# Patient Record
Sex: Female | Born: 1995 | Race: White | Hispanic: No | Marital: Married | Smoking: Never smoker
Health system: Southern US, Community
[De-identification: ages and names within clinical notes are randomized; demographics above are authoritative.]

## PROBLEM LIST (undated history)

## (undated) DIAGNOSIS — E119 Type 2 diabetes mellitus without complications: Secondary | ICD-10-CM

## (undated) DIAGNOSIS — Z8489 Family history of other specified conditions: Secondary | ICD-10-CM

## (undated) DIAGNOSIS — Z789 Other specified health status: Secondary | ICD-10-CM

## (undated) DIAGNOSIS — O24419 Gestational diabetes mellitus in pregnancy, unspecified control: Secondary | ICD-10-CM

## (undated) DIAGNOSIS — F419 Anxiety disorder, unspecified: Secondary | ICD-10-CM

## (undated) HISTORY — DX: Gestational diabetes mellitus in pregnancy, unspecified control: O24.419

## (undated) HISTORY — PX: WISDOM TOOTH EXTRACTION: SHX21

## (undated) HISTORY — PX: NO PAST SURGERIES: SHX2092

## (undated) HISTORY — DX: Other specified health status: Z78.9

---
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
## 2016-12-09 DIAGNOSIS — L709 Acne, unspecified: Secondary | ICD-10-CM | POA: Diagnosis not present

## 2017-03-22 DIAGNOSIS — L255 Unspecified contact dermatitis due to plants, except food: Secondary | ICD-10-CM | POA: Diagnosis not present

## 2017-03-24 DIAGNOSIS — M546 Pain in thoracic spine: Secondary | ICD-10-CM | POA: Diagnosis not present

## 2017-03-24 DIAGNOSIS — M62830 Muscle spasm of back: Secondary | ICD-10-CM | POA: Diagnosis not present

## 2017-03-24 DIAGNOSIS — M545 Low back pain: Secondary | ICD-10-CM | POA: Diagnosis not present

## 2017-03-27 DIAGNOSIS — M546 Pain in thoracic spine: Secondary | ICD-10-CM | POA: Diagnosis not present

## 2017-03-27 DIAGNOSIS — M545 Low back pain: Secondary | ICD-10-CM | POA: Diagnosis not present

## 2017-03-27 DIAGNOSIS — M62830 Muscle spasm of back: Secondary | ICD-10-CM | POA: Diagnosis not present

## 2017-04-06 DIAGNOSIS — M545 Low back pain: Secondary | ICD-10-CM | POA: Diagnosis not present

## 2017-04-06 DIAGNOSIS — M62830 Muscle spasm of back: Secondary | ICD-10-CM | POA: Diagnosis not present

## 2017-04-06 DIAGNOSIS — M546 Pain in thoracic spine: Secondary | ICD-10-CM | POA: Diagnosis not present

## 2017-04-19 DIAGNOSIS — M62830 Muscle spasm of back: Secondary | ICD-10-CM | POA: Diagnosis not present

## 2017-04-19 DIAGNOSIS — M546 Pain in thoracic spine: Secondary | ICD-10-CM | POA: Diagnosis not present

## 2017-04-19 DIAGNOSIS — M545 Low back pain: Secondary | ICD-10-CM | POA: Diagnosis not present

## 2017-04-20 DIAGNOSIS — M546 Pain in thoracic spine: Secondary | ICD-10-CM | POA: Diagnosis not present

## 2017-04-20 DIAGNOSIS — M545 Low back pain: Secondary | ICD-10-CM | POA: Diagnosis not present

## 2017-04-20 DIAGNOSIS — M62830 Muscle spasm of back: Secondary | ICD-10-CM | POA: Diagnosis not present

## 2017-04-26 DIAGNOSIS — M545 Low back pain: Secondary | ICD-10-CM | POA: Diagnosis not present

## 2017-04-26 DIAGNOSIS — M62830 Muscle spasm of back: Secondary | ICD-10-CM | POA: Diagnosis not present

## 2017-04-26 DIAGNOSIS — M546 Pain in thoracic spine: Secondary | ICD-10-CM | POA: Diagnosis not present

## 2017-04-27 DIAGNOSIS — M545 Low back pain: Secondary | ICD-10-CM | POA: Diagnosis not present

## 2017-04-27 DIAGNOSIS — M546 Pain in thoracic spine: Secondary | ICD-10-CM | POA: Diagnosis not present

## 2017-04-27 DIAGNOSIS — M62830 Muscle spasm of back: Secondary | ICD-10-CM | POA: Diagnosis not present

## 2017-05-02 DIAGNOSIS — M62830 Muscle spasm of back: Secondary | ICD-10-CM | POA: Diagnosis not present

## 2017-05-02 DIAGNOSIS — M545 Low back pain: Secondary | ICD-10-CM | POA: Diagnosis not present

## 2017-05-02 DIAGNOSIS — M546 Pain in thoracic spine: Secondary | ICD-10-CM | POA: Diagnosis not present

## 2017-05-04 DIAGNOSIS — M62830 Muscle spasm of back: Secondary | ICD-10-CM | POA: Diagnosis not present

## 2017-05-04 DIAGNOSIS — M546 Pain in thoracic spine: Secondary | ICD-10-CM | POA: Diagnosis not present

## 2017-05-04 DIAGNOSIS — M545 Low back pain: Secondary | ICD-10-CM | POA: Diagnosis not present

## 2017-05-10 DIAGNOSIS — M545 Low back pain: Secondary | ICD-10-CM | POA: Diagnosis not present

## 2017-05-10 DIAGNOSIS — M62830 Muscle spasm of back: Secondary | ICD-10-CM | POA: Diagnosis not present

## 2017-05-10 DIAGNOSIS — M546 Pain in thoracic spine: Secondary | ICD-10-CM | POA: Diagnosis not present

## 2017-05-21 DIAGNOSIS — J069 Acute upper respiratory infection, unspecified: Secondary | ICD-10-CM | POA: Diagnosis not present

## 2017-07-03 DIAGNOSIS — L709 Acne, unspecified: Secondary | ICD-10-CM | POA: Diagnosis not present

## 2017-07-03 DIAGNOSIS — Z1389 Encounter for screening for other disorder: Secondary | ICD-10-CM | POA: Diagnosis not present

## 2017-09-07 DIAGNOSIS — Z23 Encounter for immunization: Secondary | ICD-10-CM | POA: Diagnosis not present

## 2017-11-02 DIAGNOSIS — L700 Acne vulgaris: Secondary | ICD-10-CM | POA: Diagnosis not present

## 2017-12-04 DIAGNOSIS — L700 Acne vulgaris: Secondary | ICD-10-CM | POA: Diagnosis not present

## 2017-12-07 DIAGNOSIS — L700 Acne vulgaris: Secondary | ICD-10-CM | POA: Diagnosis not present

## 2018-01-08 DIAGNOSIS — L700 Acne vulgaris: Secondary | ICD-10-CM | POA: Diagnosis not present

## 2018-01-15 DIAGNOSIS — L700 Acne vulgaris: Secondary | ICD-10-CM | POA: Diagnosis not present

## 2018-01-25 DIAGNOSIS — L700 Acne vulgaris: Secondary | ICD-10-CM | POA: Diagnosis not present

## 2018-03-02 DIAGNOSIS — L700 Acne vulgaris: Secondary | ICD-10-CM | POA: Diagnosis not present

## 2018-04-04 DIAGNOSIS — L700 Acne vulgaris: Secondary | ICD-10-CM | POA: Diagnosis not present

## 2018-04-18 DIAGNOSIS — L700 Acne vulgaris: Secondary | ICD-10-CM | POA: Diagnosis not present

## 2018-05-07 DIAGNOSIS — L700 Acne vulgaris: Secondary | ICD-10-CM | POA: Diagnosis not present

## 2018-06-07 DIAGNOSIS — L700 Acne vulgaris: Secondary | ICD-10-CM | POA: Diagnosis not present

## 2018-06-13 DIAGNOSIS — Z23 Encounter for immunization: Secondary | ICD-10-CM | POA: Diagnosis not present

## 2018-06-18 DIAGNOSIS — Z111 Encounter for screening for respiratory tuberculosis: Secondary | ICD-10-CM | POA: Diagnosis not present

## 2018-07-17 DIAGNOSIS — L700 Acne vulgaris: Secondary | ICD-10-CM | POA: Diagnosis not present

## 2018-08-17 DIAGNOSIS — L700 Acne vulgaris: Secondary | ICD-10-CM | POA: Diagnosis not present

## 2018-09-17 DIAGNOSIS — L700 Acne vulgaris: Secondary | ICD-10-CM | POA: Diagnosis not present

## 2018-10-29 DIAGNOSIS — L700 Acne vulgaris: Secondary | ICD-10-CM | POA: Diagnosis not present

## 2018-12-12 DIAGNOSIS — L700 Acne vulgaris: Secondary | ICD-10-CM | POA: Diagnosis not present

## 2020-12-30 ENCOUNTER — Ambulatory Visit (INDEPENDENT_AMBULATORY_CARE_PROVIDER_SITE_OTHER): Payer: No Typology Code available for payment source | Admitting: Family Medicine

## 2020-12-30 ENCOUNTER — Other Ambulatory Visit: Payer: Self-pay

## 2020-12-30 ENCOUNTER — Other Ambulatory Visit (HOSPITAL_COMMUNITY)
Admission: RE | Admit: 2020-12-30 | Discharge: 2020-12-30 | Disposition: A | Discharge status: Home / Self Care | Payer: No Typology Code available for payment source | Source: Ambulatory Visit | Attending: Family Medicine | Admitting: Family Medicine

## 2020-12-30 ENCOUNTER — Encounter: Payer: Self-pay | Admitting: Family Medicine

## 2020-12-30 VITALS — BP 138/82 | HR 103 | Ht 61.0 in | Wt 164.0 lb

## 2020-12-30 DIAGNOSIS — Z01419 Encounter for gynecological examination (general) (routine) without abnormal findings: Secondary | ICD-10-CM

## 2020-12-30 NOTE — Progress Notes (Signed)
GYNECOLOGY ANNUAL PREVENTATIVE CARE ENCOUNTER NOTE  Subjective:   Crystal Powell is a 25 y.o. G0P0000 female here for a routine annual gynecologic exam.  Current complaints: had nexplanon removed in October. Had heavy periods with a lot of cramping in November and December. Did have COVID vaccine in September or October.   Denies abnormal vaginal bleeding, discharge, pelvic pain, problems with intercourse or other gynecologic concerns.    Menses approximately 30 day interval. Currently trying to get pregnant. Does use ovulation kit, just to see when ovulating. Not timing sex. Ovulation on day 18.  Gynecologic History Patient's last menstrual period was 12/10/2020. Patient is sexually active  Contraception: none Last Pap: 2019. Results were: normal  Obstetric History OB History  Gravida Para Term Preterm AB Living  0 0 0 0 0 0  SAB IAB Ectopic Multiple Live Births  0 0 0 0 0    History reviewed. No pertinent past medical history.  History reviewed. No pertinent surgical history.  Current Outpatient Medications on File Prior to Visit  Medication Sig Dispense Refill  . Prenatal Vit-Fe Fumarate-FA (PRENATAL VITAMINS PO) Take by mouth.     No current facility-administered medications on file prior to visit.    Allergies  Allergen Reactions  . Peanut-Containing Drug Products     Social History   Socioeconomic History  . Marital status: Married    Spouse name: Not on file  . Number of children: Not on file  . Years of education: Not on file  . Highest education level: Not on file  Occupational History  . Not on file  Tobacco Use  . Smoking status: Never Smoker  . Smokeless tobacco: Never Used  Substance and Sexual Activity  . Alcohol use: Never  . Drug use: Never  . Sexual activity: Yes    Birth control/protection: None  Other Topics Concern  . Not on file  Social History Narrative  . Not on file   Social Determinants of Health   Financial Resource  Strain: Not on file  Food Insecurity: Not on file  Transportation Needs: Not on file  Physical Activity: Not on file  Stress: Not on file  Social Connections: Not on file  Intimate Partner Violence: Not on file    History reviewed. No pertinent family history.  The following portions of the patient's history were reviewed and updated as appropriate: allergies, current medications, past family history, past medical history, past social history, past surgical history and problem list.  Review of Systems Pertinent items are noted in HPI.   Objective:  BP 138/82   Pulse (!) 103   Ht 5' 1" (1.549 m)   Wt 164 lb (74.4 kg)   LMP 12/10/2020   BMI 30.99 kg/m  Wt Readings from Last 3 Encounters:  12/30/20 164 lb (74.4 kg)     Chaperone present during exam  CONSTITUTIONAL: Well-developed, well-nourished female in no acute distress.  HENT:  Normocephalic, atraumatic, External right and left ear normal. Oropharynx is clear and moist EYES: Conjunctivae and EOM are normal. Pupils are equal, round, and reactive to light. No scleral icterus.  NECK: Normal range of motion, supple, no masses.  Normal thyroid.   CARDIOVASCULAR: Normal heart rate noted, regular rhythm RESPIRATORY: Clear to auscultation bilaterally. Effort and breath sounds normal, no problems with respiration noted. BREASTS: Symmetric in size. No masses, skin changes, nipple drainage, or lymphadenopathy. ABDOMEN: Soft, normal bowel sounds, no distention noted.  No tenderness, rebound or guarding.  PELVIC: Normal appearing  external genitalia; normal appearing vaginal mucosa and cervix.  No abnormal discharge noted.  Normal uterine size, no other palpable masses, no uterine or adnexal tenderness. MUSCULOSKELETAL: Normal range of motion. No tenderness.  No cyanosis, clubbing, or edema.  2+ distal pulses. SKIN: Skin is warm and dry. No rash noted. Not diaphoretic. No erythema. No pallor. NEUROLOGIC: Alert and oriented to person,  place, and time. Normal reflexes, muscle tone coordination. No cranial nerve deficit noted. PSYCHIATRIC: Normal mood and affect. Normal behavior. Normal judgment and thought content.  Assessment:  Annual gynecologic examination with pap smear   Plan:  1. Well Woman Exam Will follow up results of pap smear and manage accordingly. STD testing discussed. Patient declined testing - Cytology - PAP( )   Routine preventative health maintenance measures emphasized. Please refer to After Visit Summary for other counseling recommendations.    Jacob Stinson, DO [facility] 

## 2020-12-31 LAB — CYTOLOGY - PAP
Adequacy: ABSENT
Diagnosis: NEGATIVE

## 2021-03-25 ENCOUNTER — Encounter: Payer: Self-pay | Admitting: Family Medicine

## 2021-03-25 ENCOUNTER — Other Ambulatory Visit: Payer: Self-pay

## 2021-03-25 ENCOUNTER — Ambulatory Visit (INDEPENDENT_AMBULATORY_CARE_PROVIDER_SITE_OTHER): Payer: No Typology Code available for payment source | Admitting: Family Medicine

## 2021-03-25 VITALS — BP 135/81 | HR 96 | Wt 163.0 lb

## 2021-03-25 DIAGNOSIS — Z3169 Encounter for other general counseling and advice on procreation: Secondary | ICD-10-CM

## 2021-03-25 NOTE — Progress Notes (Signed)
   Subjective:    Patient ID: Crystal Powell, female    DOB: July 05, 1996, 25 y.o.   MRN: [id number]  HPI  Crystal Powell is a 25 y.o. G0P0000 female here for difficulty with conception.  Current complaints: has been using ovulation kit for several months. Having sex several times a week.    Menses approximately 30 day interval. Currently trying to get pregnant. Does use ovulation kit, just to see when ovulating. Not timing sex. Ovulation on day 18.   Review of Systems     Objective:   Physical Exam Vitals reviewed.  Constitutional:      Appearance: Normal appearance.  Cardiovascular:     Rate and Rhythm: Normal rate and regular rhythm.     Pulses: Normal pulses.     Heart sounds: Normal heart sounds.  Pulmonary:     Effort: Pulmonary effort is normal.     Breath sounds: Normal breath sounds.  Abdominal:     General: Abdomen is flat. There is no distension.     Palpations: Abdomen is soft. There is no mass.     Tenderness: There is no abdominal tenderness.     Hernia: No hernia is present.  Skin:    Capillary Refill: Capillary refill takes less than 2 seconds.  Neurological:     General: No focal deficit present.     Mental Status: She is alert.  Psychiatric:        Mood and Affect: Mood normal.        Behavior: Behavior normal.        Thought Content: Thought content normal.        Judgment: Judgment normal.        Assessment & Plan:  1. Infertility counseling Will check HSG and semen analysis. Refer to REI. - DG Hysterogram (HSG); Future - Ambulatory referral to Endocrinology

## 2021-04-13 ENCOUNTER — Encounter: Payer: Self-pay | Admitting: General Practice

## 2021-04-15 DIAGNOSIS — N469 Male infertility, unspecified: Secondary | ICD-10-CM

## 2021-04-16 NOTE — Addendum Note (Signed)
Addended by: Levie Heritage on: 04/16/2021 07:07 PM   Modules accepted: Orders

## 2021-04-20 ENCOUNTER — Encounter: Payer: Self-pay | Admitting: General Practice

## 2021-06-01 ENCOUNTER — Other Ambulatory Visit: Payer: Self-pay

## 2021-06-01 ENCOUNTER — Inpatient Hospital Stay (HOSPITAL_COMMUNITY): Payer: No Typology Code available for payment source

## 2021-06-01 ENCOUNTER — Encounter (HOSPITAL_COMMUNITY): Payer: Self-pay | Admitting: Family Medicine

## 2021-06-01 ENCOUNTER — Inpatient Hospital Stay (HOSPITAL_COMMUNITY)
Admission: AD | Admit: 2021-06-01 | Discharge: 2021-06-02 | Disposition: A | Discharge status: Home / Self Care | Payer: No Typology Code available for payment source | Attending: Family Medicine | Admitting: Family Medicine

## 2021-06-01 DIAGNOSIS — O209 Hemorrhage in early pregnancy, unspecified: Secondary | ICD-10-CM | POA: Diagnosis present

## 2021-06-01 DIAGNOSIS — O468X1 Other antepartum hemorrhage, first trimester: Secondary | ICD-10-CM

## 2021-06-01 DIAGNOSIS — O208 Other hemorrhage in early pregnancy: Secondary | ICD-10-CM | POA: Insufficient documentation

## 2021-06-01 DIAGNOSIS — O3680X0 Pregnancy with inconclusive fetal viability, not applicable or unspecified: Secondary | ICD-10-CM

## 2021-06-01 DIAGNOSIS — Z3A01 Less than 8 weeks gestation of pregnancy: Secondary | ICD-10-CM | POA: Insufficient documentation

## 2021-06-01 LAB — URINALYSIS, ROUTINE W REFLEX MICROSCOPIC
Bilirubin Urine: NEGATIVE
Glucose, UA: NEGATIVE mg/dL
Hgb urine dipstick: NEGATIVE
Ketones, ur: 5 mg/dL — AB
Leukocytes,Ua: NEGATIVE
Nitrite: NEGATIVE
Protein, ur: NEGATIVE mg/dL
Specific Gravity, Urine: 1.005 (ref 1.005–1.030)
pH: 6 (ref 5.0–8.0)

## 2021-06-01 LAB — COMPREHENSIVE METABOLIC PANEL
ALT: 32 U/L (ref 0–44)
AST: 31 U/L (ref 15–41)
Albumin: 4.3 g/dL (ref 3.5–5.0)
Alkaline Phosphatase: 39 U/L (ref 38–126)
Anion gap: 12 (ref 5–15)
BUN: 5 mg/dL — ABNORMAL LOW (ref 6–20)
CO2: 20 mmol/L — ABNORMAL LOW (ref 22–32)
Calcium: 8.9 mg/dL (ref 8.9–10.3)
Chloride: 101 mmol/L (ref 98–111)
Creatinine, Ser: 0.66 mg/dL (ref 0.44–1.00)
GFR, Estimated: >60 mL/min (ref >60)
Glucose, Bld: 80 mg/dL (ref 70–99)
Potassium: 3.5 mmol/L (ref 3.5–5.1)
Sodium: 133 mmol/L — ABNORMAL LOW (ref 135–145)
Total Bilirubin: 1.1 mg/dL (ref 0.3–1.2)
Total Protein: 7.3 g/dL (ref 6.5–8.1)

## 2021-06-01 LAB — TYPE AND SCREEN
ABO/RH(D): A NEG
Antibody Screen: NEGATIVE

## 2021-06-01 LAB — POCT PREGNANCY, URINE: Preg Test, Ur: POSITIVE — AB

## 2021-06-01 LAB — WET PREP, GENITAL
Clue Cells Wet Prep HPF POC: NONE SEEN
Sperm: NONE SEEN
Trich, Wet Prep: NONE SEEN
WBC, Wet Prep HPF POC: NONE SEEN
Yeast Wet Prep HPF POC: NONE SEEN

## 2021-06-01 LAB — CBC WITH DIFFERENTIAL/PLATELET
Abs Immature Granulocytes: 0.04 10*3/uL (ref 0.00–0.07)
Basophils Absolute: 0 10*3/uL (ref 0.0–0.1)
Basophils Relative: 0 %
Eosinophils Absolute: 0.1 10*3/uL (ref 0.0–0.5)
Eosinophils Relative: 1 %
HCT: 40.6 % (ref 36.0–46.0)
Hemoglobin: 14.3 g/dL (ref 12.0–15.0)
Immature Granulocytes: 0 %
Lymphocytes Relative: 26 %
Lymphs Abs: 2.6 10*3/uL (ref 0.7–4.0)
MCH: 30.7 pg (ref 26.0–34.0)
MCHC: 35.2 g/dL (ref 30.0–36.0)
MCV: 87.1 fL (ref 80.0–100.0)
Monocytes Absolute: 0.5 10*3/uL (ref 0.1–1.0)
Monocytes Relative: 5 %
Neutro Abs: 6.8 10*3/uL (ref 1.7–7.7)
Neutrophils Relative %: 68 %
Platelets: 333 10*3/uL (ref 150–400)
RBC: 4.66 MIL/uL (ref 3.87–5.11)
RDW: 12 % (ref 11.5–15.5)
WBC: 10.1 10*3/uL (ref 4.0–10.5)
nRBC: 0 % (ref 0.0–0.2)

## 2021-06-01 LAB — HCG, QUANTITATIVE, PREGNANCY: hCG, Beta Chain, Quant, S: 18168 m[IU]/mL — ABNORMAL HIGH (ref <5)

## 2021-06-01 MED ORDER — RHO D IMMUNE GLOBULIN 1500 UNIT/2ML IJ SOSY
300.0000 ug | PREFILLED_SYRINGE | Freq: Once | INTRAMUSCULAR | Status: AC
  Administered 2021-06-01: 300 ug via INTRAMUSCULAR
  Filled 2021-06-01: qty 2

## 2021-06-01 NOTE — MAU Note (Signed)
Pt instructed and successfully obtained vaginal swabs. Understands will go to u/s when u/s is ready

## 2021-06-01 NOTE — MAU Note (Signed)
Crystal Powell is a 25 y.o. at [redacted]w[redacted]d here in MAU reporting: last Wednesday she thought she may have a yeast infection so she got some Monistat 7. Noticed some pink discharge with that. Yesterday saw some bright red bleeding with some clots. Since then has had bleeding every time she wipes. No pain.   LMP: 04/10/2021  Onset of complaint: yesterday  Pain score: 0/10  Vitals:   06/01/21 1833  BP: (!) 145/85  Pulse: (!) 108  Resp: 16  Temp: 98.4 F (36.9 C)  SpO2: 98%     Lab orders placed from triage: ua, upt

## 2021-06-01 NOTE — MAU Provider Note (Addendum)
History     CSN: [id number]  Arrival date and time: 06/01/21 1805   Event Date/Time   First Provider Initiated Contact with Patient 06/01/21 2131      Chief Complaint  Patient presents with   Vaginal Bleeding   Crystal Powell is a 25 y.o. G1P0000 at [redacted]w[redacted]d who presents to MAU for vaginal bleeding which began about one week ago. Patient reports initially it was simply pink spotting when wiping, but then yesterday she experienced some small pea-sized clots, and today she is experiencing brown spotting when wiping. Patient denies any pain.  Patient's husband present for visit.  Pt denies vaginal discharge/odor/itching. Pt denies N/V, abdominal pain, constipation, diarrhea, or urinary problems. Pt denies fever, chills, fatigue, sweating or changes in appetite. Pt denies SOB or chest pain. Pt denies dizziness, HA, light-headedness, weakness.   OB History     Gravida  1   Para  0   Term  0   Preterm  0   AB  0   Living  0      SAB  0   IAB  0   Ectopic  0   Multiple  0   Live Births  0           No past medical history on file.  No past surgical history on file.  No family history on file.  Social History   Tobacco Use   Smoking status: Never   Smokeless tobacco: Never  Substance Use Topics   Alcohol use: Never   Drug use: Never    Allergies:  Allergies  Allergen Reactions   Peanut-Containing Drug Products     Medications Prior to Admission  Medication Sig Dispense Refill Last Dose   Prenatal Vit-Fe Fumarate-FA (PRENATAL VITAMINS PO) Take by mouth.       Review of Systems  Constitutional:  Negative for chills, diaphoresis, fatigue and fever.  Eyes:  Negative for visual disturbance.  Respiratory:  Negative for shortness of breath.   Cardiovascular:  Negative for chest pain.  Gastrointestinal:  Negative for abdominal pain, constipation, diarrhea, nausea and vomiting.  Genitourinary:  Positive for vaginal bleeding. Negative for  dysuria, flank pain, frequency, pelvic pain, urgency and vaginal discharge.  Neurological:  Negative for dizziness, weakness, light-headedness and headaches.   Physical Exam   Blood pressure (!) 145/85, pulse (!) 108, temperature 98.4 F (36.9 C), temperature source Oral, resp. rate 16, height 5' (1.524 m), weight 74.8 kg, last menstrual period 04/10/2021, SpO2 98 %.  Patient Vitals for the past 24 hrs:  BP Temp Temp src Pulse Resp SpO2 Height Weight  06/01/21 1833 (!) 145/85 98.4 F (36.9 C) Oral (!) 108 16 98 % 5' (1.524 m) 74.8 kg   Physical Exam Vitals and nursing note reviewed.  Constitutional:      General: She is not in acute distress.    Appearance: Normal appearance. She is not ill-appearing, toxic-appearing or diaphoretic.  HENT:     Head: Normocephalic and atraumatic.  Pulmonary:     Effort: Pulmonary effort is normal.  Neurological:     Mental Status: She is alert and oriented to person, place, and time.  Psychiatric:        Mood and Affect: Mood normal.        Behavior: Behavior normal.        Thought Content: Thought content normal.        Judgment: Judgment normal.   Results for orders placed or performed during the hospital  encounter of 06/01/21 (from the past 24 hour(s))  Urinalysis, Routine w reflex microscopic     Status: Abnormal   Collection Time: 06/01/21  6:27 PM  Result Value Ref Range   Color, Urine YELLOW YELLOW   APPearance CLEAR CLEAR   Specific Gravity, Urine 1.005 1.005 - 1.030   pH 6.0 5.0 - 8.0   Glucose, UA NEGATIVE NEGATIVE mg/dL   Hgb urine dipstick NEGATIVE NEGATIVE   Bilirubin Urine NEGATIVE NEGATIVE   Ketones, ur 5 (A) NEGATIVE mg/dL   Protein, ur NEGATIVE NEGATIVE mg/dL   Nitrite NEGATIVE NEGATIVE   Leukocytes,Ua NEGATIVE NEGATIVE  Pregnancy, urine POC     Status: Abnormal   Collection Time: 06/01/21  6:27 PM  Result Value Ref Range   Preg Test, Ur POSITIVE (A) NEGATIVE  CBC with Differential     Status: None   Collection Time:  06/01/21  6:53 PM  Result Value Ref Range   WBC 10.1 4.0 - 10.5 K/uL   RBC 4.66 3.87 - 5.11 MIL/uL   Hemoglobin 14.3 12.0 - 15.0 g/dL   HCT 14.4 31.5 - 40.0 %   MCV 87.1 80.0 - 100.0 fL   MCH 30.7 26.0 - 34.0 pg   MCHC 35.2 30.0 - 36.0 g/dL   RDW 86.7 61.9 - 50.9 %   Platelets 333 150 - 400 K/uL   nRBC 0.0 0.0 - 0.2 %   Neutrophils Relative % 68 %   Neutro Abs 6.8 1.7 - 7.7 K/uL   Lymphocytes Relative 26 %   Lymphs Abs 2.6 0.7 - 4.0 K/uL   Monocytes Relative 5 %   Monocytes Absolute 0.5 0.1 - 1.0 K/uL   Eosinophils Relative 1 %   Eosinophils Absolute 0.1 0.0 - 0.5 K/uL   Basophils Relative 0 %   Basophils Absolute 0.0 0.0 - 0.1 K/uL   Immature Granulocytes 0 %   Abs Immature Granulocytes 0.04 0.00 - 0.07 K/uL  Type and screen [facility]     Status: None   Collection Time: 06/01/21  6:53 PM  Result Value Ref Range   ABO/RH(D) A NEG    Antibody Screen NEG    Sample Expiration      06/04/2021,2359 Performed at [facility], [address] [place], [place] [address]   hCG, quantitative, pregnancy     Status: Abnormal   Collection Time: 06/01/21  6:53 PM  Result Value Ref Range   hCG, Beta Chain, Quant, S 18,168 (H) <5 mIU/mL  Comprehensive metabolic panel     Status: Abnormal   Collection Time: 06/01/21  7:51 PM  Result Value Ref Range   Sodium 133 (L) 135 - 145 mmol/L   Potassium 3.5 3.5 - 5.1 mmol/L   Chloride 101 98 - 111 mmol/L   CO2 20 (L) 22 - 32 mmol/L   Glucose, Bld 80 70 - 99 mg/dL   BUN 5 (L) 6 - 20 mg/dL   Creatinine, Ser 2.45 0.44 - 1.00 mg/dL   Calcium 8.9 8.9 - 80.9 mg/dL   Total Protein 7.3 6.5 - 8.1 g/dL   Albumin 4.3 3.5 - 5.0 g/dL   AST 31 15 - 41 U/L   ALT 32 0 - 44 U/L   Alkaline Phosphatase 39 38 - 126 U/L   Total Bilirubin 1.1 0.3 - 1.2 mg/dL   GFR, Estimated >98 >33 mL/min   Anion gap 12 5 - 15  Rh IG workup (includes ABO/Rh)     Status: None (Preliminary  result)   Collection Time: 06/01/21  7:52 PM  Result Value  Ref Range   Gestational Age(Wks) 7    Unit Number [id number]    Blood Component Type RHIG    Unit division 00    Status of Unit ISSUED    Transfusion Status      OK TO TRANSFUSE Performed at [facility], [address], [place], [place] [address]    No results found.  MAU Course  Procedures  MDM -ectopic orders entered -report given to M. Mayford Knife, CNM Marylen Ponto, NP  9:44 PM 06/01/2021  Results reviewed with patient US OB Transvaginal  Result Date: 06/01/2021 CLINICAL DATA:  Vaginal bleeding in early pregnancy. Gestational age by LMP 7 weeks 3 days. Beta hCG is pending. EXAM: TRANSVAGINAL OB ULTRASOUND TECHNIQUE: Transvaginal ultrasound was performed for complete evaluation of the gestation as well as the maternal uterus, adnexal regions, and pelvic cul-de-sac. COMPARISON:  None. FINDINGS: Intrauterine gestational sac: Single Yolk sac:  Visualized. Embryo:  Visualized. Cardiac Activity: Visualized. Heart Rate: 100 bpm CRL:   3.6 mm   6 w 0 d                  [place] EDC: 01/25/2022 Subchorionic hemorrhage: Small, posterosuperior to the gestational sac. Maternal uterus/adnexae: Both ovaries are visualized and are normal. No adnexal mass or pelvic free fluid. IMPRESSION: 1. Single live intrauterine pregnancy estimated gestational age [redacted] weeks 0 days based on crown-rump length for ultrasound [facility] 01/25/2022. Borderline fetal bradycardia may be due to early gestational age. 2. Small subchorionic hemorrhage. Electronically Signed   By: Narda Rutherford M.D.   On: 06/01/2021 23:29    Discussed FHR is deemed borderline low by Radiologist.  Discussed we will repeat her [place] at her new OB appt which is scheduled at the [facility] office Discussed small [facility] not associated with risk of miscarriage.  Recommend pelvic rest  Assessment and Plan  A:  [place] IUP at [redacted]w[redacted]d by LMP, 6 wk by [place]      Bleeding in early pregnancy       Small subchorionic hemorrhage  P:   Discharge home       Pelvic  rest       Has new OB appt scheduled        Encouraged to return if she develops worsening of symptoms, increase in pain, fever, or other concerning symptoms.   Aviva Signs, CNM

## 2021-06-02 DIAGNOSIS — O209 Hemorrhage in early pregnancy, unspecified: Secondary | ICD-10-CM | POA: Diagnosis present

## 2021-06-02 DIAGNOSIS — Z3A01 Less than 8 weeks gestation of pregnancy: Secondary | ICD-10-CM

## 2021-06-02 DIAGNOSIS — O208 Other hemorrhage in early pregnancy: Secondary | ICD-10-CM | POA: Diagnosis not present

## 2021-06-02 LAB — RH IG WORKUP (INCLUDES ABO/RH)
Gestational Age(Wks): 7
Unit division: 0

## 2021-06-02 LAB — GC/CHLAMYDIA PROBE AMP (~~LOC~~) NOT AT ARMC
Chlamydia: NEGATIVE
Comment: NEGATIVE
Comment: NORMAL
Neisseria Gonorrhea: NEGATIVE

## 2021-06-02 NOTE — Progress Notes (Signed)
Wynelle Bourgeois CNM in earlier to discuss test result and d/c plan. Written and verbal dc instructions given and understanding voiced

## 2021-07-01 ENCOUNTER — Other Ambulatory Visit (HOSPITAL_COMMUNITY)
Admission: RE | Admit: 2021-07-01 | Discharge: 2021-07-01 | Disposition: A | Discharge status: Home / Self Care | Payer: No Typology Code available for payment source | Source: Ambulatory Visit | Attending: Obstetrics and Gynecology | Admitting: Obstetrics and Gynecology

## 2021-07-01 ENCOUNTER — Ambulatory Visit (INDEPENDENT_AMBULATORY_CARE_PROVIDER_SITE_OTHER): Payer: No Typology Code available for payment source | Admitting: Obstetrics and Gynecology

## 2021-07-01 ENCOUNTER — Encounter: Payer: Self-pay | Admitting: Obstetrics and Gynecology

## 2021-07-01 ENCOUNTER — Other Ambulatory Visit: Payer: Self-pay

## 2021-07-01 VITALS — BP 130/80 | HR 103 | Wt 166.0 lb

## 2021-07-01 DIAGNOSIS — Z6791 Unspecified blood type, Rh negative: Secondary | ICD-10-CM | POA: Insufficient documentation

## 2021-07-01 DIAGNOSIS — O2341 Unspecified infection of urinary tract in pregnancy, first trimester: Secondary | ICD-10-CM

## 2021-07-01 DIAGNOSIS — O26899 Other specified pregnancy related conditions, unspecified trimester: Secondary | ICD-10-CM

## 2021-07-01 DIAGNOSIS — Z3A10 10 weeks gestation of pregnancy: Secondary | ICD-10-CM | POA: Diagnosis not present

## 2021-07-01 DIAGNOSIS — Z3400 Encounter for supervision of normal first pregnancy, unspecified trimester: Secondary | ICD-10-CM | POA: Insufficient documentation

## 2021-07-01 LAB — POCT URINALYSIS DIPSTICK OB
Bilirubin, UA: NEGATIVE
Blood, UA: NEGATIVE
Glucose, UA: NEGATIVE
Ketones, UA: NEGATIVE
Nitrite, UA: POSITIVE
POC,PROTEIN,UA: NEGATIVE
Spec Grav, UA: 1.025 (ref 1.010–1.025)
Urobilinogen, UA: 0.2 E.U./dL
pH, UA: 6 (ref 5.0–8.0)

## 2021-07-01 NOTE — Progress Notes (Addendum)
INITIAL PRENATAL VISIT NOTE  Subjective:  Crystal Powell is a 25 y.o. G1P0000 at [redacted]w[redacted]d by 6 weeks [place] being seen today for her initial prenatal visit. This is a planned pregnancy. She and partner are happy with the pregnancy. She was using nothing for birth control previously. She has an obstetric history significant for n/a. She has a medical history significant for n/a.  Patient reports no complaints.  Contractions: Not present. Vag. Bleeding: None.  Movement: Absent. Denies leaking of fluid. No bleeding since just after MAU visit.   No past medical history on file.  No past surgical history on file.  OB History  Gravida Para Term Preterm AB Living  1 0 0 0 0 0  SAB IAB Ectopic Multiple Live Births  0 0 0 0 0    # Outcome Date GA Lbr Len/2nd Weight Sex Delivery Anes PTL Lv  1 Current             Social History   Socioeconomic History   Marital status: Married    Spouse name: Not on file   Number of children: Not on file   Years of education: Not on file   Highest education level: Not on file  Occupational History   Not on file  Tobacco Use   Smoking status: Never   Smokeless tobacco: Never  Substance and Sexual Activity   Alcohol use: Never   Drug use: Never   Sexual activity: Yes    Birth control/protection: None  Other Topics Concern   Not on file  Social History Narrative   Not on file   Social Determinants of Health   Financial Resource Strain: Not on file  Food Insecurity: Not on file  Transportation Needs: Not on file  Physical Activity: Not on file  Stress: Not on file  Social Connections: Not on file    No family history on file.   Current Outpatient Medications:    Prenatal Vit-Fe Fumarate-FA (PRENATAL VITAMINS PO), Take by mouth., Disp: , Rfl:   Allergies  Allergen Reactions   Peanut-Containing Drug Products     Review of Systems: Negative except for what is mentioned in HPI.  Objective:   Vitals:   07/01/21 0807  BP: 130/80   Pulse: (!) 103  Weight: 166 lb (75.3 kg)    Fetal Status:     Movement: Absent     Physical Exam: BP 130/80   Pulse (!) 103   Wt 166 lb (75.3 kg)   LMP 04/10/2021   BMI 32.42 kg/m  CONSTITUTIONAL: Well-developed, well-nourished female in no acute distress.  NEUROLOGIC: Alert and oriented to person, place, and time. Normal reflexes, muscle tone coordination. No cranial nerve deficit noted. PSYCHIATRIC: Normal mood and affect. Normal behavior. Normal judgment and thought content. SKIN: Skin is warm and dry. No rash noted. Not diaphoretic. No erythema. No pallor. HENT:  Normocephalic, atraumatic, External right and left ear normal. Oropharynx is clear and moist EYES: Conjunctivae and EOM are normal. Pupils are equal, round, and reactive to light. No scleral icterus.  NECK: Normal range of motion, supple, no masses CARDIOVASCULAR: Normal heart rate noted, regular rhythm RESPIRATORY: Effort and breath sounds normal, no problems with respiration noted BREASTS: symmetric, non-tender, no masses palpable ABDOMEN: Soft, nontender, nondistended, gravid. GU: normal appearing external female genitalia, nulliparous normal appearing cervix, scant white discharge in vagina, no lesions noted Bimanual: 11 weeks sized uterus, no adnexal tenderness or palpable lesions noted MUSCULOSKELETAL: Normal range of motion. EXT:  No edema  and no tenderness. 2+ distal pulses.   Assessment and Plan:  Pregnancy: G1P0000 at [redacted]w[redacted]d by 6 week [place]  1. [redacted] weeks gestation of pregnancy  2. Supervision of normal first pregnancy, antepartum Reviewed [facility] [employer] structure, multiple providers, fellows, medical students, virtual visits, MyChart.  - CBC/D/Plt+RPR+Rh+ABO+RubIgG... - Culture, OB Urine - [place] MFM OB COMP + 14 WK; Future - Hemoglobin A1c - GC/Chlamydia probe amp ([place])not at [facility] - [facility] AMB BABYSCRIPTS OPT IN - Genetic Screening - POC Urinalysis Dipstick OB  3. Rh negative  state in antepartum period Received Rho gam at 6 weeks in MAU for bleeding None since    Preterm labor symptoms and general obstetric precautions including but not limited to vaginal bleeding, contractions, leaking of fluid and fetal movement were reviewed in detail with the patient.  Please refer to After Visit Summary for other counseling recommendations.   Return in about 4 weeks (around 07/29/2021) for low OB, in person.  Conan Bowens 07/01/2021 8:25 AM

## 2021-07-01 NOTE — Addendum Note (Signed)
Addended by: Mikey Bussing on: 07/01/2021 08:51 AM   Modules accepted: Orders

## 2021-07-01 NOTE — Progress Notes (Signed)
DATING AND VIABILITY SONOGRAM   Crystal Powell is a 25 y.o. year old G1P0000 with LMP Patient's last menstrual period was 04/10/2021. which would correlate to  [redacted]w[redacted]d weeks gestation.  She has regular menstrual cycles.   She is here today for a confirmatory initial sonogram.    GESTATION: SINGLETON     FETAL ACTIVITY:          Heart rate         171 bpm          The fetus is active.    GESTATIONAL AGE AND  BIOMETRICS:  Gestational criteria: Estimated Date of Delivery: 01/25/22 by LMP now at [redacted]w[redacted]d  Previous Scans:1      CROWN RUMP LENGTH           2.95 cm         9-6 weeks                                                                               AVERAGE EGA(BY THIS SCAN):  9-6 weeks  WORKING EDD( LMP ):  01/25/2022     TECHNICIAN COMMENTS: Patient informed that the ultrasound is considered a limited obstetric ultrasound and is not intended to be a complete ultrasound exam.  Patient also informed that the ultrasound is not being completed with the intent of assessing for fetal or placental anomalies or any pelvic abnormalities. Explained that the purpose of today's ultrasound is to assess for fetal heart rate.  Patient acknowledges the purpose of the exam and the limitations of thestudy   Armandina Stammer 07/01/2021 9:08 AM

## 2021-07-02 LAB — GC/CHLAMYDIA PROBE AMP (~~LOC~~) NOT AT ARMC
Chlamydia: NEGATIVE
Comment: NEGATIVE
Comment: NORMAL
Neisseria Gonorrhea: NEGATIVE

## 2021-07-04 LAB — URINE CULTURE, OB REFLEX

## 2021-07-04 LAB — CULTURE, OB URINE

## 2021-07-05 ENCOUNTER — Other Ambulatory Visit: Payer: Self-pay

## 2021-07-05 MED ORDER — CEPHALEXIN 500 MG PO CAPS: 500.0000 mg | ORAL_CAPSULE | Freq: Four times a day (QID) | ORAL | 0 refills | Status: AC

## 2021-07-06 ENCOUNTER — Other Ambulatory Visit: Payer: Self-pay | Admitting: Family Medicine

## 2021-07-06 DIAGNOSIS — O2340 Unspecified infection of urinary tract in pregnancy, unspecified trimester: Secondary | ICD-10-CM | POA: Insufficient documentation

## 2021-07-06 LAB — CBC/D/PLT+RPR+RH+ABO+RUBIGG...
Basophils Absolute: 0.1 10*3/uL (ref 0.0–0.2)
Basos: 1 %
EOS (ABSOLUTE): 0.1 10*3/uL (ref 0.0–0.4)
Eos: 1 %
HCV Ab: <0.1 s/co ratio (ref 0.0–0.9)
HIV Screen 4th Generation wRfx: NONREACTIVE
Hematocrit: 41 % (ref 34.0–46.6)
Hemoglobin: 14.3 g/dL (ref 11.1–15.9)
Hepatitis B Surface Ag: NEGATIVE
Immature Grans (Abs): 0 10*3/uL (ref 0.0–0.1)
Immature Granulocytes: 0 %
Lymphocytes Absolute: 1.8 10*3/uL (ref 0.7–3.1)
Lymphs: 20 %
MCH: 30.5 pg (ref 26.6–33.0)
MCHC: 34.9 g/dL (ref 31.5–35.7)
MCV: 87 fL (ref 79–97)
Monocytes Absolute: 0.4 10*3/uL (ref 0.1–0.9)
Monocytes: 5 %
Neutrophils Absolute: 6.7 10*3/uL (ref 1.4–7.0)
Neutrophils: 73 %
Platelets: 319 10*3/uL (ref 150–450)
RBC: 4.69 x10E6/uL (ref 3.77–5.28)
RDW: 12.5 % (ref 11.7–15.4)
RPR Ser Ql: NONREACTIVE
Rh Factor: NEGATIVE
Rubella Antibodies, IGG: 7.68 index (ref >0.99)
WBC: 9 10*3/uL (ref 3.4–10.8)

## 2021-07-06 LAB — AB SCR+ANTIBODY ID: Antibody Screen: POSITIVE — AB

## 2021-07-06 LAB — HEMOGLOBIN A1C
Est. average glucose Bld gHb Est-mCnc: 103 mg/dL
Hgb A1c MFr Bld: 5.2 % (ref 4.8–5.6)

## 2021-07-06 LAB — HCV INTERPRETATION

## 2021-07-06 MED ORDER — CEFDINIR 300 MG PO CAPS: 300.0000 mg | ORAL_CAPSULE | Freq: Two times a day (BID) | ORAL | 0 refills | Status: AC

## 2021-07-14 ENCOUNTER — Other Ambulatory Visit: Payer: Self-pay

## 2021-07-14 DIAGNOSIS — B379 Candidiasis, unspecified: Secondary | ICD-10-CM

## 2021-07-14 MED ORDER — TERCONAZOLE 0.4 % VA CREA: TOPICAL_CREAM | VAGINAL | 0 refills | Status: DC

## 2021-07-14 NOTE — Progress Notes (Signed)
Pt called requesting a Rx for yeast.  Pt states she just finished taking antibiotics for a UTI and now she has a yeast infection. Terconazole 7  0.4% 1 applicator intravaginally QHS for 3 days was sent to her pharmacy. Understanding was voiced. Arihanna Estabrook l Cameran Pettey, CMA

## 2021-07-29 ENCOUNTER — Ambulatory Visit (INDEPENDENT_AMBULATORY_CARE_PROVIDER_SITE_OTHER): Payer: No Typology Code available for payment source | Admitting: Family Medicine

## 2021-07-29 ENCOUNTER — Other Ambulatory Visit: Payer: Self-pay

## 2021-07-29 VITALS — BP 145/83 | HR 97 | Temp 98.5°F | Wt 165.0 lb

## 2021-07-29 DIAGNOSIS — Z3400 Encounter for supervision of normal first pregnancy, unspecified trimester: Secondary | ICD-10-CM

## 2021-07-29 DIAGNOSIS — O26899 Other specified pregnancy related conditions, unspecified trimester: Secondary | ICD-10-CM

## 2021-07-29 DIAGNOSIS — Z6791 Unspecified blood type, Rh negative: Secondary | ICD-10-CM

## 2021-07-29 DIAGNOSIS — Z3A14 14 weeks gestation of pregnancy: Secondary | ICD-10-CM

## 2021-07-29 DIAGNOSIS — R030 Elevated blood-pressure reading, without diagnosis of hypertension: Secondary | ICD-10-CM

## 2021-07-29 DIAGNOSIS — Z87440 Personal history of urinary (tract) infections: Secondary | ICD-10-CM

## 2021-07-29 LAB — POCT URINALYSIS DIPSTICK OB
Bilirubin, UA: NEGATIVE
Blood, UA: NEGATIVE
Glucose, UA: NEGATIVE
Ketones, UA: NEGATIVE
Nitrite, UA: POSITIVE
POC,PROTEIN,UA: NEGATIVE
Spec Grav, UA: 1.02 (ref 1.010–1.025)
pH, UA: 6.5 (ref 5.0–8.0)

## 2021-07-29 NOTE — Addendum Note (Signed)
Addended by: Lorelle Gibbs L on: 07/29/2021 09:09 AM   Modules accepted: Orders

## 2021-07-29 NOTE — Progress Notes (Addendum)
   PRENATAL VISIT NOTE  Subjective:  Crystal Powell is a 25 y.o. G1P0000 at [redacted]w[redacted]d being seen today for ongoing prenatal care.  She is currently monitored for the following issues for this low-risk pregnancy and has Antepartum bleeding, first trimester; Supervision of normal first pregnancy, antepartum; Rh negative state in antepartum period; and UTI (urinary tract infection) during pregnancy on their problem list.  Patient reports no complaints.  Contractions: Not present. Vag. Bleeding: None.  Movement: Absent. Denies leaking of fluid.   The following portions of the patient's history were reviewed and updated as appropriate: allergies, current medications, past family history, past medical history, past social history, past surgical history and problem list.   Objective:   Vitals:   07/29/21 0838  BP: (!) 145/83  Pulse: 97  Temp: 98.5 F (36.9 C)  Weight: 165 lb 0.6 oz (74.9 kg)    Fetal Status: Fetal Heart Rate (bpm): 156   Movement: Absent     General:  Alert, oriented and cooperative. Patient is in no acute distress.  Skin: Skin is warm and dry. No rash noted.   Cardiovascular: Normal heart rate noted  Respiratory: Normal respiratory effort, no problems with respiration noted  Abdomen: Soft, gravid, appropriate for gestational age.  Pain/Pressure: Absent     Pelvic: Cervical exam deferred        Extremities: Normal range of motion.  Edema: None  Mental Status: Normal mood and affect. Normal behavior. Normal judgment and thought content.   Assessment and Plan:  Pregnancy: G1P0000 at [redacted]w[redacted]d 1. [redacted] weeks gestation of pregnancy  2. Supervision of normal first pregnancy, antepartum FHT and FH normal  3. Rh negative state in antepartum period Rhogam at 28 week  4. History of UTI Went through course of antibiotics. Recheck today. - POC Urinalysis Dipstick OB - Culture, OB Urine  5. Elevated BP without diagnosis of hypertension Elevated BP today Had borderline (Stage 1  HTN, 130/80) prepregnancy. Start ASA 81mg  Will keep close eye on BP. If continues to be elevated, will get serial and treat as CHTN.    Preterm labor symptoms and general obstetric precautions including but not limited to vaginal bleeding, contractions, leaking of fluid and fetal movement were reviewed in detail with the patient. Please refer to After Visit Summary for other counseling recommendations.   No follow-ups on file.  Future Appointments  Date Time Provider Department Center  08/31/2021  8:15 AM [facility] US2 [facility]    [facility] DO

## 2021-08-01 LAB — URINE CULTURE, OB REFLEX

## 2021-08-01 LAB — CULTURE, OB URINE

## 2021-08-03 MED ORDER — SULFAMETHOXAZOLE-TRIMETHOPRIM 800-160 MG PO TABS: 1.0000 | ORAL_TABLET | Freq: Two times a day (BID) | ORAL | 1 refills | Status: DC

## 2021-08-03 NOTE — Addendum Note (Signed)
Addended by: Levie Heritage on: 08/03/2021 02:16 PM   Modules accepted: Orders

## 2021-08-04 MED ORDER — TERCONAZOLE 0.4 % VA CREA: 1.0000 | TOPICAL_CREAM | Freq: Every day | VAGINAL | 0 refills | Status: DC

## 2021-08-13 ENCOUNTER — Other Ambulatory Visit: Payer: Self-pay | Admitting: Hematology and Oncology

## 2021-08-25 ENCOUNTER — Other Ambulatory Visit: Payer: Self-pay

## 2021-08-25 ENCOUNTER — Ambulatory Visit (INDEPENDENT_AMBULATORY_CARE_PROVIDER_SITE_OTHER): Payer: No Typology Code available for payment source | Admitting: Family Medicine

## 2021-08-25 VITALS — BP 136/77 | HR 108 | Wt 167.0 lb

## 2021-08-25 DIAGNOSIS — Z3A18 18 weeks gestation of pregnancy: Secondary | ICD-10-CM

## 2021-08-25 DIAGNOSIS — Z3400 Encounter for supervision of normal first pregnancy, unspecified trimester: Secondary | ICD-10-CM

## 2021-08-25 DIAGNOSIS — Z87440 Personal history of urinary (tract) infections: Secondary | ICD-10-CM

## 2021-08-25 LAB — POCT URINALYSIS DIPSTICK OB
Bilirubin, UA: NEGATIVE
Blood, UA: NEGATIVE
Glucose, UA: NEGATIVE
Ketones, UA: NEGATIVE
Leukocytes, UA: NEGATIVE
Nitrite, UA: NEGATIVE
POC,PROTEIN,UA: NEGATIVE
Spec Grav, UA: 1.02 (ref 1.010–1.025)
Urobilinogen, UA: 0.2 E.U./dL
pH, UA: 6.5 (ref 5.0–8.0)

## 2021-08-25 NOTE — Progress Notes (Signed)
   PRENATAL VISIT NOTE  Subjective:  Crystal Powell is a 25 y.o. G1P0000 at [redacted]w[redacted]d being seen today for ongoing prenatal care.  She is currently monitored for the following issues for this low-risk pregnancy and has Antepartum bleeding, first trimester; Supervision of normal first pregnancy, antepartum; Rh negative state in antepartum period; and UTI (urinary tract infection) during pregnancy on their problem list.  Patient reports no complaints.  Contractions: Not present. Vag. Bleeding: None.  Movement: Present. Denies leaking of fluid.   The following portions of the patient's history were reviewed and updated as appropriate: allergies, current medications, past family history, past medical history, past social history, past surgical history and problem list.   Objective:   Vitals:   08/25/21 0909  BP: 136/77  Pulse: (!) 108  Weight: 167 lb (75.8 kg)    Fetal Status: Fetal Heart Rate (bpm): 150   Movement: Present     General:  Alert, oriented and cooperative. Patient is in no acute distress.  Skin: Skin is warm and dry. No rash noted.   Cardiovascular: Normal heart rate noted  Respiratory: Normal respiratory effort, no problems with respiration noted  Abdomen: Soft, gravid, appropriate for gestational age.  Pain/Pressure: Absent     Pelvic: Cervical exam deferred        Extremities: Normal range of motion.  Edema: None  Mental Status: Normal mood and affect. Normal behavior. Normal judgment and thought content.   Assessment and Plan:  Pregnancy: G1P0000 at [redacted]w[redacted]d 1. [redacted] weeks gestation of pregnancy - AFP, Serum, Open Spina Bifida  2. Supervision of normal first pregnancy, antepartum FHT and FH normal - AFP, Serum, Open Spina Bifida  3. History of UTI Urine dip negative - POC Urinalysis Dipstick OB  Preterm labor symptoms and general obstetric precautions including but not limited to vaginal bleeding, contractions, leaking of fluid and fetal movement were reviewed in  detail with the patient. Please refer to After Visit Summary for other counseling recommendations.   No follow-ups on file.  Future Appointments  Date Time Provider Department Center  08/31/2021  8:15 AM [facility] US2 [facility]  09/22/2021  9:55 AM Levie Heritage, DO [facility] None  10/20/2021  8:35 AM Levie Heritage, DO [facility] None    Levie Heritage, DO

## 2021-08-27 LAB — AFP, SERUM, OPEN SPINA BIFIDA
AFP MoM: 0.98
AFP Value: 42 ng/mL
Gest. Age on Collection Date: 18.1 weeks
Maternal Age At EDD: 25 yr
OSBR Risk 1 IN: 10000
Test Results:: NEGATIVE
Weight: 167 [lb_av]

## 2021-08-31 ENCOUNTER — Other Ambulatory Visit: Payer: Self-pay

## 2021-08-31 ENCOUNTER — Other Ambulatory Visit: Payer: Self-pay | Admitting: *Deleted

## 2021-08-31 ENCOUNTER — Other Ambulatory Visit: Payer: Self-pay | Admitting: Obstetrics and Gynecology

## 2021-08-31 ENCOUNTER — Ambulatory Visit: Payer: No Typology Code available for payment source | Attending: Obstetrics and Gynecology

## 2021-08-31 DIAGNOSIS — Z3400 Encounter for supervision of normal first pregnancy, unspecified trimester: Secondary | ICD-10-CM

## 2021-08-31 DIAGNOSIS — Z362 Encounter for other antenatal screening follow-up: Secondary | ICD-10-CM

## 2021-09-22 ENCOUNTER — Ambulatory Visit (INDEPENDENT_AMBULATORY_CARE_PROVIDER_SITE_OTHER): Payer: No Typology Code available for payment source | Admitting: Family Medicine

## 2021-09-22 ENCOUNTER — Other Ambulatory Visit: Payer: Self-pay

## 2021-09-22 VITALS — BP 132/83 | HR 100 | Wt 171.0 lb

## 2021-09-22 DIAGNOSIS — Z3A22 22 weeks gestation of pregnancy: Secondary | ICD-10-CM

## 2021-09-22 DIAGNOSIS — Z6791 Unspecified blood type, Rh negative: Secondary | ICD-10-CM

## 2021-09-22 DIAGNOSIS — O26899 Other specified pregnancy related conditions, unspecified trimester: Secondary | ICD-10-CM

## 2021-09-22 DIAGNOSIS — Z3400 Encounter for supervision of normal first pregnancy, unspecified trimester: Secondary | ICD-10-CM

## 2021-09-22 NOTE — Progress Notes (Signed)
   PRENATAL VISIT NOTE  Subjective:  Crystal Powell is a 25 y.o. G1P0000 at [redacted]w[redacted]d being seen today for ongoing prenatal care.  She is currently monitored for the following issues for this low-risk pregnancy and has Antepartum bleeding, first trimester; Supervision of normal first pregnancy, antepartum; Rh negative state in antepartum period; and UTI (urinary tract infection) during pregnancy on their problem list.  Patient reports no complaints.  Contractions: Not present. Vag. Bleeding: None.  Movement: Present. Denies leaking of fluid.   The following portions of the patient's history were reviewed and updated as appropriate: allergies, current medications, past family history, past medical history, past social history, past surgical history and problem list.   Objective:   Vitals:   09/22/21 0946 09/22/21 0949  BP: (!) 146/74 132/83  Pulse: (!) 101 100  Weight: 171 lb (77.6 kg)     Fetal Status: Fetal Heart Rate (bpm): 152   Movement: Present     General:  Alert, oriented and cooperative. Patient is in no acute distress.  Skin: Skin is warm and dry. No rash noted.   Cardiovascular: Normal heart rate noted  Respiratory: Normal respiratory effort, no problems with respiration noted  Abdomen: Soft, gravid, appropriate for gestational age.  Pain/Pressure: Absent     Pelvic: Cervical exam deferred        Extremities: Normal range of motion.  Edema: None  Mental Status: Normal mood and affect. Normal behavior. Normal judgment and thought content.   Assessment and Plan:  Pregnancy: G1P0000 at [redacted]w[redacted]d 1. [redacted] weeks gestation of pregnancy FHT and FH normal  2. Supervision of normal first pregnancy, antepartum  3. Rh negative state in antepartum period Rhogam at 28 weeks  Preterm labor symptoms and general obstetric precautions including but not limited to vaginal bleeding, contractions, leaking of fluid and fetal movement were reviewed in detail with the patient. Please refer to After  Visit Summary for other counseling recommendations.   No follow-ups on file.  Future Appointments  Date Time Provider Department Center  09/27/2021  9:30 AM [facility] NURSE [facility]  09/27/2021  9:45 AM [facility] US5 [facility]  10/19/2021  8:35 AM Gerrit Heck, CNM [facility] None  11/03/2021  9:55 AM Milas Hock, MD [facility] None  11/17/2021  9:55 AM Levie Heritage, DO [facility] None    Levie Heritage, DO

## 2021-09-27 ENCOUNTER — Ambulatory Visit: Payer: No Typology Code available for payment source | Attending: Obstetrics

## 2021-09-27 ENCOUNTER — Other Ambulatory Visit: Payer: Self-pay

## 2021-09-27 ENCOUNTER — Ambulatory Visit: Payer: No Typology Code available for payment source | Admitting: *Deleted

## 2021-09-27 ENCOUNTER — Encounter: Payer: Self-pay | Admitting: *Deleted

## 2021-09-27 ENCOUNTER — Other Ambulatory Visit: Payer: Self-pay | Admitting: *Deleted

## 2021-09-27 VITALS — BP 143/80 | HR 93

## 2021-09-27 DIAGNOSIS — E669 Obesity, unspecified: Secondary | ICD-10-CM | POA: Diagnosis not present

## 2021-09-27 DIAGNOSIS — Z3A22 22 weeks gestation of pregnancy: Secondary | ICD-10-CM | POA: Diagnosis not present

## 2021-09-27 DIAGNOSIS — Z362 Encounter for other antenatal screening follow-up: Secondary | ICD-10-CM | POA: Diagnosis present

## 2021-09-27 DIAGNOSIS — O209 Hemorrhage in early pregnancy, unspecified: Secondary | ICD-10-CM

## 2021-09-27 DIAGNOSIS — Z3400 Encounter for supervision of normal first pregnancy, unspecified trimester: Secondary | ICD-10-CM | POA: Insufficient documentation

## 2021-09-27 DIAGNOSIS — O99212 Obesity complicating pregnancy, second trimester: Secondary | ICD-10-CM

## 2021-10-19 ENCOUNTER — Ambulatory Visit (INDEPENDENT_AMBULATORY_CARE_PROVIDER_SITE_OTHER): Payer: No Typology Code available for payment source

## 2021-10-19 ENCOUNTER — Other Ambulatory Visit: Payer: Self-pay

## 2021-10-19 VITALS — BP 127/70 | HR 101 | Wt 175.0 lb

## 2021-10-19 DIAGNOSIS — O26899 Other specified pregnancy related conditions, unspecified trimester: Secondary | ICD-10-CM

## 2021-10-19 DIAGNOSIS — O99210 Obesity complicating pregnancy, unspecified trimester: Secondary | ICD-10-CM | POA: Insufficient documentation

## 2021-10-19 DIAGNOSIS — Z3400 Encounter for supervision of normal first pregnancy, unspecified trimester: Secondary | ICD-10-CM

## 2021-10-19 DIAGNOSIS — Z6791 Unspecified blood type, Rh negative: Secondary | ICD-10-CM | POA: Diagnosis not present

## 2021-10-19 DIAGNOSIS — O360920 Maternal care for other rhesus isoimmunization, second trimester, not applicable or unspecified: Secondary | ICD-10-CM

## 2021-10-19 DIAGNOSIS — Z3A26 26 weeks gestation of pregnancy: Secondary | ICD-10-CM | POA: Diagnosis not present

## 2021-10-19 DIAGNOSIS — Z23 Encounter for immunization: Secondary | ICD-10-CM | POA: Diagnosis not present

## 2021-10-19 MED ORDER — RHO D IMMUNE GLOBULIN 1500 UNIT/2ML IJ SOSY
300.0000 ug | PREFILLED_SYRINGE | Freq: Once | INTRAMUSCULAR | Status: AC
  Administered 2021-10-19: 300 ug via INTRAMUSCULAR

## 2021-10-19 NOTE — Progress Notes (Signed)
   LOW-RISK PREGNANCY OFFICE VISIT  Patient name: Crystal Powell MRN [id number]  Date of birth: [birth year] Chief Complaint:   Routine Prenatal Visit  Subjective:   Crystal Powell is a 25 y.o. G1P0000 female at [redacted]w[redacted]d with an Estimated Date of Delivery: 01/25/22 being seen today for ongoing management of a low-risk pregnancy aeb has Antepartum bleeding, first trimester; Supervision of normal first pregnancy, antepartum; Rh negative state in antepartum period; and UTI (urinary tract infection) during pregnancy on their problem list.  Patient presents today with no complaints.  Patient endorses fetal movement. Patient denies abdominal cramping or contractions.  Patient denies vaginal concerns including abnormal discharge, leaking of fluid, and bleeding.  Contractions: Not present. Vag. Bleeding: None.  Movement: Present.  Reviewed past medical,surgical, social, obstetrical and family history as well as problem list, medications and allergies.  Objective   Vitals:   10/19/21 0822  BP: 127/70  Pulse: (!) 101  Weight: 175 lb (79.4 kg)  Body mass index is 34.18 kg/m.  Total Weight Gain:17 lb (7.711 kg)         Physical Examination:   General appearance: Well appearing, and in no distress  Mental status: Alert, oriented to person, place, and time  Skin: Warm & dry  Cardiovascular: Normal heart rate noted  Respiratory: Normal respiratory effort, no distress  Abdomen: Soft, gravid, nontender, AGA with Fundal Height: 29 cm  Pelvic: Cervical exam deferred           Extremities: Edema: None  Fetal Status: Fetal Heart Rate (bpm): 143  Movement: Present   No results found for this or any previous visit (from the past 24 hour(s)).  Assessment & Plan:  Low-risk pregnancy of a 25 y.o., G1P0000 at [redacted]w[redacted]d with an Estimated Date of Delivery: 01/25/22   1. [redacted] weeks gestation of pregnancy -Anticipatory guidance for upcoming appts. -Patient to schedule next appt in 2-4 weeks for an in-person  visit. -Fundus today at 58. Informed that slightly larger than expected. -Patient scheduled for growth [place] on Dec 13th d/t BMI >30  2. Supervision of normal first pregnancy, antepartum -Doing well. -Discussed recommendation for CB class. -Patient scheduled for December and reports has completed a breastfeeding class.  3. Rh negative state in antepartum period -Given today.      Meds:  Meds ordered this encounter  Medications   rho (d) immune globulin (RHIG/RHOPHYLAC) injection 300 mcg   Labs/procedures today: Lab Orders         CBC         Glucose Tolerance, 2 Hours w/1 Hour         HIV Antibody (routine testing w rflx)         RPR      Reviewed: Preterm labor symptoms and general obstetric precautions including but not limited to vaginal bleeding, contractions, leaking of fluid and fetal movement were reviewed in detail with the patient.  All questions were answered.  Follow-up: Return in about 4 weeks (around 11/16/2021) for LROB.  Orders Placed This Encounter  Procedures   Tdap vaccine greater than or equal to 7yo IM   CBC   Glucose Tolerance, 2 Hours w/1 Hour   HIV Antibody (routine testing w rflx)   RPR   Cherre Robins MSN, CNM 10/19/2021

## 2021-10-20 ENCOUNTER — Other Ambulatory Visit: Payer: Self-pay

## 2021-10-20 ENCOUNTER — Encounter: Payer: Self-pay | Admitting: Family Medicine

## 2021-10-20 ENCOUNTER — Telehealth: Payer: Self-pay

## 2021-10-20 ENCOUNTER — Encounter: Payer: No Typology Code available for payment source | Admitting: Family Medicine

## 2021-10-20 DIAGNOSIS — Z3A26 26 weeks gestation of pregnancy: Secondary | ICD-10-CM

## 2021-10-20 DIAGNOSIS — O24419 Gestational diabetes mellitus in pregnancy, unspecified control: Secondary | ICD-10-CM

## 2021-10-20 LAB — CBC
Hematocrit: 37.4 % (ref 34.0–46.6)
Hemoglobin: 12.8 g/dL (ref 11.1–15.9)
MCH: 29.9 pg (ref 26.6–33.0)
MCHC: 34.2 g/dL (ref 31.5–35.7)
MCV: 87 fL (ref 79–97)
Platelets: 249 10*3/uL (ref 150–450)
RBC: 4.28 x10E6/uL (ref 3.77–5.28)
RDW: 12.8 % (ref 11.7–15.4)
WBC: 11.3 10*3/uL — ABNORMAL HIGH (ref 3.4–10.8)

## 2021-10-20 LAB — GLUCOSE TOLERANCE, 2 HOURS W/ 1HR
Glucose, 1 hour: 185 mg/dL — ABNORMAL HIGH (ref 70–179)
Glucose, 2 hour: 134 mg/dL (ref 70–152)
Glucose, Fasting: 85 mg/dL (ref 70–91)

## 2021-10-20 LAB — RPR: RPR Ser Ql: NONREACTIVE

## 2021-10-20 LAB — HIV ANTIBODY (ROUTINE TESTING W REFLEX): HIV Screen 4th Generation wRfx: NONREACTIVE

## 2021-10-20 MED ORDER — ACCU-CHEK SOFTCLIX LANCETS MISC: 12 refills | Status: DC

## 2021-10-20 MED ORDER — ACCU-CHEK GUIDE W/DEVICE KIT: PACK | 0 refills | Status: DC

## 2021-10-20 MED ORDER — GLUCOSE BLOOD VI STRP: ORAL_STRIP | 12 refills | Status: DC

## 2021-10-20 NOTE — Progress Notes (Signed)
Referral placed for diabetes management. Armandina Stammer RN

## 2021-10-20 NOTE — Telephone Encounter (Signed)
Pt called to discuss 2 hr GTT results. Pt made aware that she failed her 1 hr GTT and she has Gestational Diabetes. Pt made aware that she will be referred to Diabetes Education and testing supplies will be sent to her pharmacy. Understanding was voiced. Jaida Basurto l Cyril Woodmansee, CMA

## 2021-10-21 DIAGNOSIS — O24410 Gestational diabetes mellitus in pregnancy, diet controlled: Secondary | ICD-10-CM | POA: Insufficient documentation

## 2021-10-25 ENCOUNTER — Other Ambulatory Visit: Payer: Self-pay

## 2021-10-25 MED ORDER — GLUCOSE BLOOD VI STRP: ORAL_STRIP | 12 refills | Status: DC

## 2021-11-03 ENCOUNTER — Encounter: Payer: No Typology Code available for payment source | Attending: Obstetrics & Gynecology | Admitting: Registered"

## 2021-11-03 ENCOUNTER — Other Ambulatory Visit: Payer: Self-pay

## 2021-11-03 ENCOUNTER — Encounter: Payer: No Typology Code available for payment source | Admitting: Obstetrics and Gynecology

## 2021-11-03 DIAGNOSIS — O24410 Gestational diabetes mellitus in pregnancy, diet controlled: Secondary | ICD-10-CM | POA: Insufficient documentation

## 2021-11-04 ENCOUNTER — Encounter: Payer: Self-pay | Admitting: Registered"

## 2021-11-04 NOTE — Progress Notes (Signed)
Patient was seen on 11/03/21 for Gestational Diabetes self-management class at [facility]. The following learning objectives were met by the patient during this course:  States the definition of Gestational Diabetes States why dietary management is important in controlling blood glucose Describes the effects each nutrient has on blood glucose levels Demonstrates ability to create a balanced meal plan Demonstrates carbohydrate counting  States when to check blood glucose levels Demonstrates proper blood glucose monitoring techniques States the effect of stress and exercise on blood glucose levels States the importance of limiting caffeine and abstaining from alcohol and smoking  Blood glucose monitor given: Patient has meter and is checking blood sugar prior to class   Patient instructed to monitor glucose levels: FBS: 60 - <95; 1 hour: <140; 2 hour: <120  Patient received handouts: Nutrition Diabetes and Pregnancy, including carb counting list  Patient will be seen for follow-up as needed.

## 2021-11-09 ENCOUNTER — Encounter: Payer: Self-pay | Admitting: *Deleted

## 2021-11-09 ENCOUNTER — Other Ambulatory Visit: Payer: Self-pay

## 2021-11-09 ENCOUNTER — Other Ambulatory Visit: Payer: Self-pay | Admitting: *Deleted

## 2021-11-09 ENCOUNTER — Ambulatory Visit: Payer: No Typology Code available for payment source | Admitting: *Deleted

## 2021-11-09 ENCOUNTER — Ambulatory Visit: Payer: No Typology Code available for payment source | Attending: Obstetrics and Gynecology

## 2021-11-09 VITALS — BP 155/73 | HR 94

## 2021-11-09 DIAGNOSIS — Z362 Encounter for other antenatal screening follow-up: Secondary | ICD-10-CM | POA: Diagnosis not present

## 2021-11-09 DIAGNOSIS — O209 Hemorrhage in early pregnancy, unspecified: Secondary | ICD-10-CM

## 2021-11-09 DIAGNOSIS — O24419 Gestational diabetes mellitus in pregnancy, unspecified control: Secondary | ICD-10-CM | POA: Diagnosis not present

## 2021-11-09 DIAGNOSIS — E669 Obesity, unspecified: Secondary | ICD-10-CM | POA: Diagnosis not present

## 2021-11-09 DIAGNOSIS — O99212 Obesity complicating pregnancy, second trimester: Secondary | ICD-10-CM

## 2021-11-09 DIAGNOSIS — Z3400 Encounter for supervision of normal first pregnancy, unspecified trimester: Secondary | ICD-10-CM

## 2021-11-09 DIAGNOSIS — O24410 Gestational diabetes mellitus in pregnancy, diet controlled: Secondary | ICD-10-CM

## 2021-11-09 DIAGNOSIS — Z3A29 29 weeks gestation of pregnancy: Secondary | ICD-10-CM | POA: Diagnosis not present

## 2021-11-09 DIAGNOSIS — Z6830 Body mass index (BMI) 30.0-30.9, adult: Secondary | ICD-10-CM

## 2021-11-10 ENCOUNTER — Encounter: Payer: Self-pay | Admitting: Family Medicine

## 2021-11-17 ENCOUNTER — Encounter: Payer: No Typology Code available for payment source | Admitting: Family Medicine

## 2021-11-18 ENCOUNTER — Ambulatory Visit (INDEPENDENT_AMBULATORY_CARE_PROVIDER_SITE_OTHER): Payer: No Typology Code available for payment source | Admitting: Family Medicine

## 2021-11-18 ENCOUNTER — Other Ambulatory Visit: Payer: Self-pay

## 2021-11-18 VITALS — BP 136/79 | HR 99 | Wt 175.0 lb

## 2021-11-18 DIAGNOSIS — O24410 Gestational diabetes mellitus in pregnancy, diet controlled: Secondary | ICD-10-CM

## 2021-11-18 DIAGNOSIS — O26899 Other specified pregnancy related conditions, unspecified trimester: Secondary | ICD-10-CM

## 2021-11-18 DIAGNOSIS — Z3400 Encounter for supervision of normal first pregnancy, unspecified trimester: Secondary | ICD-10-CM

## 2021-11-18 DIAGNOSIS — Z6791 Unspecified blood type, Rh negative: Secondary | ICD-10-CM

## 2021-11-18 DIAGNOSIS — Z3A30 30 weeks gestation of pregnancy: Secondary | ICD-10-CM

## 2021-11-18 NOTE — Progress Notes (Signed)
° °  PRENATAL VISIT NOTE  Subjective:  Crystal Powell is a 25 y.o. G1P0000 at [redacted]w[redacted]d being seen today for ongoing prenatal care.  She is currently monitored for the following issues for this high-risk pregnancy and has Antepartum bleeding, first trimester; Supervision of normal first pregnancy, antepartum; Rh negative state in antepartum period; UTI (urinary tract infection) during pregnancy; Obesity in pregnancy, antepartum; and Gestational diabetes mellitus in pregnancy, diet controlled on their problem list.  Patient reports no complaints.  Contractions: Not present. Vag. Bleeding: None.  Movement: Present. Denies leaking of fluid.   The following portions of the patient's history were reviewed and updated as appropriate: allergies, current medications, past family history, past medical history, past social history, past surgical history and problem list.   Objective:   Vitals:   11/18/21 1329  BP: 136/79  Pulse: 99  Weight: 175 lb (79.4 kg)    Fetal Status: Fetal Heart Rate (bpm): 133   Movement: Present     General:  Alert, oriented and cooperative. Patient is in no acute distress.  Skin: Skin is warm and dry. No rash noted.   Cardiovascular: Normal heart rate noted  Respiratory: Normal respiratory effort, no problems with respiration noted  Abdomen: Soft, gravid, appropriate for gestational age.  Pain/Pressure: Absent     Pelvic: Cervical exam deferred        Extremities: Normal range of motion.  Edema: None  Mental Status: Normal mood and affect. Normal behavior. Normal judgment and thought content.   Assessment and Plan:  Pregnancy: G1P0000 at [redacted]w[redacted]d 1. [redacted] weeks gestation of pregnancy  2. Supervision of normal first pregnancy, antepartum FHT and FH normal  3. Diet controlled gestational diabetes mellitus (GDM) in third trimester CBGs all controlled. Fastings in the 80s. PP 80-90. Change checks to twice a day. Growth scan around 36 weeks  4. Rh negative state in  antepartum period   Preterm labor symptoms and general obstetric precautions including but not limited to vaginal bleeding, contractions, leaking of fluid and fetal movement were reviewed in detail with the patient. Please refer to After Visit Summary for other counseling recommendations.   No follow-ups on file.  Future Appointments  Date Time Provider Department Center  12/08/2021  9:00 AM [facility] NURSE [facility]  12/08/2021  9:15 AM [facility] US2 [facility]    Levie Heritage, DO

## 2021-11-28 NOTE — L&D Delivery Note (Signed)
Delivery Note Called to room and patient was complete and pushing. Head delivered 0057. No nuchal cord present. Shoulder and body delivered in usual fashion. At 0057 a viable female was delivered via Vaginal, Spontaneous (Presentation:ROA).  Infant with non spontaneous cry, placed on mother's abdomen, dried and stimulated, bulb suctioned. NICU called to room due to FHR in the 90s while crowning. Cord clamped x 2 after 1-minute delay, and cut by husband Feliz Beam. Cord blood drawn. Placenta delivered spontaneously with gentle cord traction. Appears intact. Fundus firm with massage and Pitocin. Labia, perineum, vagina, and cervix inspected with 4x4, 1st degree lacerated noted.    APGAR: 5,7; weight: 3500 grams   Cord: 3VC with the following complications:None.     Anesthesia: Epidural   Episiotomy: None Lacerations: 1st degree vaginal laceration Suture Repair: 3.0 vicryl Est. Blood Loss (mL): 125  Mom to postpartum.  Baby girl [name] to Couplet care / Skin to Skin.  Iline Oven Shena Vinluan,SNM 01/22/22 1:23 AM

## 2021-12-08 ENCOUNTER — Encounter: Payer: Self-pay | Admitting: *Deleted

## 2021-12-08 ENCOUNTER — Ambulatory Visit: Payer: No Typology Code available for payment source | Admitting: *Deleted

## 2021-12-08 ENCOUNTER — Other Ambulatory Visit: Payer: Self-pay | Admitting: *Deleted

## 2021-12-08 ENCOUNTER — Ambulatory Visit: Payer: No Typology Code available for payment source | Attending: Obstetrics and Gynecology

## 2021-12-08 ENCOUNTER — Other Ambulatory Visit: Payer: Self-pay

## 2021-12-08 VITALS — BP 138/76 | HR 91

## 2021-12-08 DIAGNOSIS — O24410 Gestational diabetes mellitus in pregnancy, diet controlled: Secondary | ICD-10-CM | POA: Diagnosis not present

## 2021-12-08 DIAGNOSIS — O99213 Obesity complicating pregnancy, third trimester: Secondary | ICD-10-CM | POA: Insufficient documentation

## 2021-12-08 DIAGNOSIS — Z3A33 33 weeks gestation of pregnancy: Secondary | ICD-10-CM | POA: Diagnosis not present

## 2021-12-08 DIAGNOSIS — E669 Obesity, unspecified: Secondary | ICD-10-CM | POA: Diagnosis not present

## 2021-12-08 DIAGNOSIS — Z3400 Encounter for supervision of normal first pregnancy, unspecified trimester: Secondary | ICD-10-CM | POA: Diagnosis present

## 2021-12-08 DIAGNOSIS — Z6830 Body mass index (BMI) 30.0-30.9, adult: Secondary | ICD-10-CM

## 2021-12-08 DIAGNOSIS — O209 Hemorrhage in early pregnancy, unspecified: Secondary | ICD-10-CM | POA: Insufficient documentation

## 2021-12-09 ENCOUNTER — Ambulatory Visit (INDEPENDENT_AMBULATORY_CARE_PROVIDER_SITE_OTHER): Payer: No Typology Code available for payment source | Admitting: Family Medicine

## 2021-12-09 VITALS — BP 130/78 | HR 107 | Wt 178.0 lb

## 2021-12-09 DIAGNOSIS — Z3A33 33 weeks gestation of pregnancy: Secondary | ICD-10-CM

## 2021-12-09 DIAGNOSIS — Z3400 Encounter for supervision of normal first pregnancy, unspecified trimester: Secondary | ICD-10-CM

## 2021-12-09 DIAGNOSIS — O26899 Other specified pregnancy related conditions, unspecified trimester: Secondary | ICD-10-CM

## 2021-12-09 DIAGNOSIS — Z6791 Unspecified blood type, Rh negative: Secondary | ICD-10-CM

## 2021-12-09 DIAGNOSIS — O24419 Gestational diabetes mellitus in pregnancy, unspecified control: Secondary | ICD-10-CM

## 2021-12-09 NOTE — Progress Notes (Signed)
° °  PRENATAL VISIT NOTE  Subjective:  Crystal Powell is a 26 y.o. G1P0000 at [redacted]w[redacted]d being seen today for ongoing prenatal care.  She is currently monitored for the following issues for this high-risk pregnancy and has Antepartum bleeding, first trimester; Supervision of normal first pregnancy, antepartum; Rh negative state in antepartum period; UTI (urinary tract infection) during pregnancy; Obesity in pregnancy, antepartum; and Gestational diabetes mellitus in pregnancy, diet controlled on their problem list.  Patient reports no complaints.  Contractions: Not present. Vag. Bleeding: None.  Movement: Present. Denies leaking of fluid.   The following portions of the patient's history were reviewed and updated as appropriate: allergies, current medications, past family history, past medical history, past social history, past surgical history and problem list.   Objective:   Vitals:   12/09/21 0823  BP: 130/78  Pulse: (!) 107  Weight: 178 lb (80.7 kg)    Fetal Status: Fetal Heart Rate (bpm): 144   Movement: Present     General:  Alert, oriented and cooperative. Patient is in no acute distress.  Skin: Skin is warm and dry. No rash noted.   Cardiovascular: Normal heart rate noted  Respiratory: Normal respiratory effort, no problems with respiration noted  Abdomen: Soft, gravid, appropriate for gestational age.  Pain/Pressure: Absent     Pelvic: Cervical exam deferred        Extremities: Normal range of motion.  Edema: None  Mental Status: Normal mood and affect. Normal behavior. Normal judgment and thought content.   Assessment and Plan:  Pregnancy: G1P0000 at [redacted]w[redacted]d 1. [redacted] weeks gestation of pregnancy  2. Supervision of normal first pregnancy, antepartum FHT and FH normal  3. Gestational diabetes mellitus (GDM), antepartum, gestational diabetes method of control unspecified Checking twice a day.  CBGs controlled - 80s fasting, 90s PP.  4. Rh negative state in antepartum period S/p  rhogam  Preterm labor symptoms and general obstetric precautions including but not limited to vaginal bleeding, contractions, leaking of fluid and fetal movement were reviewed in detail with the patient. Please refer to After Visit Summary for other counseling recommendations.   No follow-ups on file.  Future Appointments  Date Time Provider [place]  12/30/2021  8:15 AM Truett Mainland, DO [facility] None  01/06/2022  8:35 AM Truett Mainland, DO [facility] None  01/06/2022 10:00 AM [facility] NURSE [facility]  01/06/2022 10:15 AM [facility] US2 [facility]  01/13/2022  8:55 AM Truett Mainland, DO [facility] None  01/19/2022  8:55 AM Truett Mainland, DO [facility] None    Truett Mainland, DO

## 2021-12-09 NOTE — Progress Notes (Signed)
Patient reports blood sugars in the 80-90s. Armandina Stammer RN

## 2021-12-29 ENCOUNTER — Encounter: Payer: Self-pay | Admitting: General Practice

## 2021-12-30 ENCOUNTER — Other Ambulatory Visit (HOSPITAL_COMMUNITY)
Admission: RE | Admit: 2021-12-30 | Discharge: 2021-12-30 | Disposition: A | Discharge status: Home / Self Care | Payer: No Typology Code available for payment source | Source: Ambulatory Visit | Attending: Family Medicine | Admitting: Family Medicine

## 2021-12-30 ENCOUNTER — Ambulatory Visit (INDEPENDENT_AMBULATORY_CARE_PROVIDER_SITE_OTHER): Payer: No Typology Code available for payment source | Admitting: Family Medicine

## 2021-12-30 ENCOUNTER — Other Ambulatory Visit: Payer: Self-pay

## 2021-12-30 VITALS — BP 131/75 | HR 96 | Wt 180.0 lb

## 2021-12-30 DIAGNOSIS — O99210 Obesity complicating pregnancy, unspecified trimester: Secondary | ICD-10-CM

## 2021-12-30 DIAGNOSIS — Z3A36 36 weeks gestation of pregnancy: Secondary | ICD-10-CM

## 2021-12-30 DIAGNOSIS — Z3403 Encounter for supervision of normal first pregnancy, third trimester: Secondary | ICD-10-CM | POA: Insufficient documentation

## 2021-12-30 DIAGNOSIS — O24410 Gestational diabetes mellitus in pregnancy, diet controlled: Secondary | ICD-10-CM

## 2021-12-30 DIAGNOSIS — Z3400 Encounter for supervision of normal first pregnancy, unspecified trimester: Secondary | ICD-10-CM

## 2021-12-30 DIAGNOSIS — Z6791 Unspecified blood type, Rh negative: Secondary | ICD-10-CM

## 2021-12-30 DIAGNOSIS — O26899 Other specified pregnancy related conditions, unspecified trimester: Secondary | ICD-10-CM

## 2021-12-30 LAB — POCT URINALYSIS DIPSTICK OB
Bilirubin, UA: NEGATIVE
Blood, UA: NEGATIVE
Glucose, UA: NEGATIVE
Ketones, UA: NEGATIVE
Nitrite, UA: NEGATIVE
POC,PROTEIN,UA: NEGATIVE
Spec Grav, UA: 1.02 (ref 1.010–1.025)
Urobilinogen, UA: 0.2 E.U./dL
pH, UA: 7 (ref 5.0–8.0)

## 2021-12-30 LAB — OB RESULTS CONSOLE GC/CHLAMYDIA: Gonorrhea: NEGATIVE

## 2021-12-30 NOTE — Progress Notes (Signed)
° °  PRENATAL VISIT NOTE  Subjective:  Crystal Powell is a 26 y.o. G1P0000 at [redacted]w[redacted]d being seen today for ongoing prenatal care.  She is currently monitored for the following issues for this high-risk pregnancy and has Antepartum bleeding, first trimester; Supervision of normal first pregnancy, antepartum; Rh negative state in antepartum period; UTI (urinary tract infection) during pregnancy; Obesity in pregnancy, antepartum; and Gestational diabetes mellitus in pregnancy, diet controlled on their problem list.  Patient reports no complaints.  Contractions: Irritability. Vag. Bleeding: None.  Movement: Present. Denies leaking of fluid.   The following portions of the patient's history were reviewed and updated as appropriate: allergies, current medications, past family history, past medical history, past social history, past surgical history and problem list.   Objective:   Vitals:   12/30/21 0817  BP: 131/75  Pulse: 96  Weight: 180 lb (81.6 kg)    Fetal Status: Fetal Heart Rate (bpm): 165   Movement: Present     General:  Alert, oriented and cooperative. Patient is in no acute distress.  Skin: Skin is warm and dry. No rash noted.   Cardiovascular: Normal heart rate noted  Respiratory: Normal respiratory effort, no problems with respiration noted  Abdomen: Soft, gravid, appropriate for gestational age.  Pain/Pressure: Present     Pelvic: Cervical exam deferred        Extremities: Normal range of motion.  Edema: None  Mental Status: Normal mood and affect. Normal behavior. Normal judgment and thought content.   Assessment and Plan:  Pregnancy: G1P0000 at [redacted]w[redacted]d 1. [redacted] weeks gestation of pregnancy - GC/Chlamydia probe amp ()not at [facility] - Strep Gp B NAA - POC Urinalysis Dipstick OB  2. Supervision of normal first pregnancy, antepartum FHT and FH normal  3. Diet controlled gestational diabetes mellitus (GDM), antepartum controlled - POC Urinalysis Dipstick OB  4. Rh  negative state in antepartum period  5. Obesity in pregnancy, antepartum  Preterm labor symptoms and general obstetric precautions including but not limited to vaginal bleeding, contractions, leaking of fluid and fetal movement were reviewed in detail with the patient. Please refer to After Visit Summary for other counseling recommendations.   No follow-ups on file.  Future Appointments  Date Time Provider Department Center  01/06/2022  8:35 AM Levie Heritage, DO [facility] None  01/06/2022 10:00 AM [facility] NURSE [facility]  01/06/2022 10:15 AM [facility] US2 [facility]  01/13/2022  8:55 AM Levie Heritage, DO [facility] None  01/19/2022  8:55 AM Levie Heritage, DO [facility] None    Levie Heritage, DO

## 2021-12-31 LAB — GC/CHLAMYDIA PROBE AMP (~~LOC~~) NOT AT ARMC
Chlamydia: NEGATIVE
Comment: NEGATIVE
Comment: NORMAL
Neisseria Gonorrhea: NEGATIVE

## 2022-01-01 LAB — STREP GP B NAA: Strep Gp B NAA: NEGATIVE

## 2022-01-06 ENCOUNTER — Encounter: Payer: Self-pay | Admitting: Family Medicine

## 2022-01-06 ENCOUNTER — Other Ambulatory Visit: Payer: Self-pay | Admitting: *Deleted

## 2022-01-06 ENCOUNTER — Ambulatory Visit: Payer: No Typology Code available for payment source | Admitting: *Deleted

## 2022-01-06 ENCOUNTER — Ambulatory Visit: Payer: No Typology Code available for payment source | Attending: Obstetrics

## 2022-01-06 ENCOUNTER — Ambulatory Visit (INDEPENDENT_AMBULATORY_CARE_PROVIDER_SITE_OTHER): Payer: No Typology Code available for payment source | Admitting: Family Medicine

## 2022-01-06 ENCOUNTER — Other Ambulatory Visit: Payer: Self-pay

## 2022-01-06 VITALS — BP 149/83 | HR 87

## 2022-01-06 VITALS — BP 134/73 | HR 94 | Wt 182.0 lb

## 2022-01-06 DIAGNOSIS — Z3A37 37 weeks gestation of pregnancy: Secondary | ICD-10-CM | POA: Insufficient documentation

## 2022-01-06 DIAGNOSIS — O26899 Other specified pregnancy related conditions, unspecified trimester: Secondary | ICD-10-CM

## 2022-01-06 DIAGNOSIS — Z6830 Body mass index (BMI) 30.0-30.9, adult: Secondary | ICD-10-CM

## 2022-01-06 DIAGNOSIS — E669 Obesity, unspecified: Secondary | ICD-10-CM | POA: Diagnosis not present

## 2022-01-06 DIAGNOSIS — O99213 Obesity complicating pregnancy, third trimester: Secondary | ICD-10-CM | POA: Diagnosis not present

## 2022-01-06 DIAGNOSIS — O24410 Gestational diabetes mellitus in pregnancy, diet controlled: Secondary | ICD-10-CM | POA: Insufficient documentation

## 2022-01-06 DIAGNOSIS — Z6791 Unspecified blood type, Rh negative: Secondary | ICD-10-CM

## 2022-01-06 DIAGNOSIS — Z3400 Encounter for supervision of normal first pregnancy, unspecified trimester: Secondary | ICD-10-CM

## 2022-01-06 DIAGNOSIS — O209 Hemorrhage in early pregnancy, unspecified: Secondary | ICD-10-CM | POA: Insufficient documentation

## 2022-01-06 NOTE — Progress Notes (Signed)
° °  PRENATAL VISIT NOTE  Subjective:  Crystal Powell is a 26 y.o. G1P0000 at [redacted]w[redacted]d being seen today for ongoing prenatal care.  She is currently monitored for the following issues for this high-risk pregnancy and has Antepartum bleeding, first trimester; Supervision of normal first pregnancy, antepartum; Rh negative state in antepartum period; UTI (urinary tract infection) during pregnancy; Obesity in pregnancy, antepartum; and Gestational diabetes mellitus in pregnancy, diet controlled on their problem list.  Patient reports no complaints.  Contractions: Irritability. Vag. Bleeding: None.  Movement: Present. Denies leaking of fluid.   The following portions of the patient's history were reviewed and updated as appropriate: allergies, current medications, past family history, past medical history, past social history, past surgical history and problem list.   Objective:   Vitals:   01/06/22 0826  BP: 134/73  Pulse: 94  Weight: 182 lb (82.6 kg)    Fetal Status:     Movement: Present     General:  Alert, oriented and cooperative. Patient is in no acute distress.  Skin: Skin is warm and dry. No rash noted.   Cardiovascular: Normal heart rate noted  Respiratory: Normal respiratory effort, no problems with respiration noted  Abdomen: Soft, gravid, appropriate for gestational age.  Pain/Pressure: Present     Pelvic: Cervical exam deferred        Extremities: Normal range of motion.  Edema: None  Mental Status: Normal mood and affect. Normal behavior. Normal judgment and thought content.   Assessment and Plan:  Pregnancy: G1P0000 at [redacted]w[redacted]d 1. [redacted] weeks gestation of pregnancy  2. Supervision of normal first pregnancy, antepartum Fht and FH normal  3. Diet controlled gestational diabetes mellitus (GDM), antepartum Controlled with diet - all values within range. [place] today  4. Rh negative state in antepartum period   Term labor symptoms and general obstetric precautions including but  not limited to vaginal bleeding, contractions, leaking of fluid and fetal movement were reviewed in detail with the patient. Please refer to After Visit Summary for other counseling recommendations.   No follow-ups on file.  Future Appointments  Date Time Provider [place]  01/06/2022 10:00 AM [facility] NURSE [facility]  01/06/2022 10:15 AM [facility] US2 [facility]  01/13/2022  8:55 AM Truett Mainland, DO [facility] None  01/19/2022  8:55 AM Truett Mainland, DO [facility] None    Truett Mainland, DO

## 2022-01-06 NOTE — Addendum Note (Signed)
Addended by: Truett Mainland on: 01/06/2022 08:46 AM   Modules accepted: Orders

## 2022-01-13 ENCOUNTER — Other Ambulatory Visit: Payer: Self-pay

## 2022-01-13 ENCOUNTER — Telehealth (HOSPITAL_COMMUNITY): Payer: Self-pay | Admitting: *Deleted

## 2022-01-13 ENCOUNTER — Ambulatory Visit (INDEPENDENT_AMBULATORY_CARE_PROVIDER_SITE_OTHER): Payer: No Typology Code available for payment source | Admitting: Family Medicine

## 2022-01-13 ENCOUNTER — Encounter (HOSPITAL_COMMUNITY): Payer: Self-pay | Admitting: *Deleted

## 2022-01-13 VITALS — BP 139/88 | HR 109 | Wt 182.0 lb

## 2022-01-13 DIAGNOSIS — R030 Elevated blood-pressure reading, without diagnosis of hypertension: Secondary | ICD-10-CM

## 2022-01-13 DIAGNOSIS — O26899 Other specified pregnancy related conditions, unspecified trimester: Secondary | ICD-10-CM

## 2022-01-13 DIAGNOSIS — O24419 Gestational diabetes mellitus in pregnancy, unspecified control: Secondary | ICD-10-CM

## 2022-01-13 DIAGNOSIS — Z3A38 38 weeks gestation of pregnancy: Secondary | ICD-10-CM

## 2022-01-13 DIAGNOSIS — Z6791 Unspecified blood type, Rh negative: Secondary | ICD-10-CM

## 2022-01-13 DIAGNOSIS — Z3400 Encounter for supervision of normal first pregnancy, unspecified trimester: Secondary | ICD-10-CM

## 2022-01-13 LAB — POCT URINALYSIS DIPSTICK
Bilirubin, UA: NEGATIVE
Blood, UA: NEGATIVE
Glucose, UA: NEGATIVE
Ketones, UA: NEGATIVE
Leukocytes, UA: NEGATIVE
Nitrite, UA: NEGATIVE
Protein, UA: NEGATIVE
Spec Grav, UA: 1.01 (ref 1.010–1.025)
Urobilinogen, UA: 0.2 E.U./dL
pH, UA: 7 (ref 5.0–8.0)

## 2022-01-13 NOTE — Progress Notes (Signed)
° °  PRENATAL VISIT NOTE  Subjective:  Crystal Powell is a 26 y.o. G1P0000 at [redacted]w[redacted]d being seen today for ongoing prenatal care.  She is currently monitored for the following issues for this high-risk pregnancy and has Antepartum bleeding, first trimester; Supervision of normal first pregnancy, antepartum; Rh negative state in antepartum period; UTI (urinary tract infection) during pregnancy; Obesity in pregnancy, antepartum; and Gestational diabetes mellitus in pregnancy, diet controlled on their problem list.  Patient reports no complaints.  Contractions: Irritability. Vag. Bleeding: None.  Movement: Present. Denies leaking of fluid.   The following portions of the patient's history were reviewed and updated as appropriate: allergies, current medications, past family history, past medical history, past social history, past surgical history and problem list.   Objective:   Vitals:   01/13/22 0844  BP: 139/88  Pulse: (!) 109  Weight: 182 lb (82.6 kg)    Fetal Status: Fetal Heart Rate (bpm): 157 Fundal Height: 38 cm Movement: Present  Presentation: Vertex  General:  Alert, oriented and cooperative. Patient is in no acute distress.  Skin: Skin is warm and dry. No rash noted.   Cardiovascular: Normal heart rate noted  Respiratory: Normal respiratory effort, no problems with respiration noted  Abdomen: Soft, gravid, appropriate for gestational age.  Pain/Pressure: Absent     Pelvic: Cervical exam deferred Dilation: 1 Effacement (%): 60 Station: -3  Extremities: Normal range of motion.  Edema: Trace  Mental Status: Normal mood and affect. Normal behavior. Normal judgment and thought content.   Assessment and Plan:  Pregnancy: G1P0000 at [redacted]w[redacted]d 1. [redacted] weeks gestation of pregnancy - POCT urinalysis dipstick  2. Supervision of normal first pregnancy, antepartum FHT and FH normal  3. Gestational diabetes mellitus (GDM), antepartum, gestational diabetes method of control  unspecified Controlled  - POCT urinalysis dipstick  4. Rh negative state in antepartum period  5. Borderline blood pressure MFM recommended delivery at 39 weeks. BP borderline today. BPP tomorrow Will schedule induction around 39 weeks. Will schedule for FB ripening outpatient.   Preterm labor symptoms and general obstetric precautions including but not limited to vaginal bleeding, contractions, leaking of fluid and fetal movement were reviewed in detail with the patient. Please refer to After Visit Summary for other counseling recommendations.   No follow-ups on file.  Future Appointments  Date Time Provider Department Center  01/14/2022  3:30 PM [facility] NURSE [facility]  01/14/2022  3:45 PM [facility]  01/20/2022  8:15 AM Levie Heritage, DO [facility] None    Levie Heritage, DO

## 2022-01-13 NOTE — Telephone Encounter (Signed)
Preadmission screen  

## 2022-01-13 NOTE — Addendum Note (Signed)
Addended by: Levie Heritage on: 01/13/2022 10:37 AM   Modules accepted: Orders

## 2022-01-14 ENCOUNTER — Ambulatory Visit: Payer: No Typology Code available for payment source | Attending: Obstetrics and Gynecology | Admitting: *Deleted

## 2022-01-14 ENCOUNTER — Ambulatory Visit: Payer: No Typology Code available for payment source

## 2022-01-14 ENCOUNTER — Ambulatory Visit (HOSPITAL_BASED_OUTPATIENT_CLINIC_OR_DEPARTMENT_OTHER): Payer: No Typology Code available for payment source

## 2022-01-14 VITALS — BP 134/81 | HR 89

## 2022-01-14 DIAGNOSIS — Z3A38 38 weeks gestation of pregnancy: Secondary | ICD-10-CM | POA: Diagnosis not present

## 2022-01-14 DIAGNOSIS — O24410 Gestational diabetes mellitus in pregnancy, diet controlled: Secondary | ICD-10-CM | POA: Diagnosis not present

## 2022-01-14 DIAGNOSIS — E669 Obesity, unspecified: Secondary | ICD-10-CM

## 2022-01-14 DIAGNOSIS — O99213 Obesity complicating pregnancy, third trimester: Secondary | ICD-10-CM | POA: Diagnosis not present

## 2022-01-14 DIAGNOSIS — O24419 Gestational diabetes mellitus in pregnancy, unspecified control: Secondary | ICD-10-CM | POA: Diagnosis present

## 2022-01-14 DIAGNOSIS — O2343 Unspecified infection of urinary tract in pregnancy, third trimester: Secondary | ICD-10-CM

## 2022-01-14 DIAGNOSIS — Z3400 Encounter for supervision of normal first pregnancy, unspecified trimester: Secondary | ICD-10-CM

## 2022-01-14 DIAGNOSIS — O209 Hemorrhage in early pregnancy, unspecified: Secondary | ICD-10-CM

## 2022-01-19 ENCOUNTER — Encounter: Payer: No Typology Code available for payment source | Admitting: Family Medicine

## 2022-01-19 ENCOUNTER — Encounter (HOSPITAL_COMMUNITY): Payer: Self-pay | Admitting: *Deleted

## 2022-01-19 ENCOUNTER — Other Ambulatory Visit: Payer: Self-pay | Admitting: Advanced Practice Midwife

## 2022-01-20 ENCOUNTER — Inpatient Hospital Stay (HOSPITAL_COMMUNITY)
Admission: AD | Admit: 2022-01-20 | Discharge: 2022-01-23 | DRG: 805 | Disposition: A | Discharge status: Home / Self Care | Payer: No Typology Code available for payment source | Attending: Obstetrics and Gynecology | Admitting: Obstetrics and Gynecology

## 2022-01-20 ENCOUNTER — Ambulatory Visit (INDEPENDENT_AMBULATORY_CARE_PROVIDER_SITE_OTHER): Payer: No Typology Code available for payment source | Admitting: Family Medicine

## 2022-01-20 ENCOUNTER — Encounter (HOSPITAL_COMMUNITY): Payer: Self-pay | Admitting: Obstetrics & Gynecology

## 2022-01-20 ENCOUNTER — Other Ambulatory Visit: Payer: Self-pay

## 2022-01-20 VITALS — BP 138/82 | HR 93 | Wt 184.0 lb

## 2022-01-20 DIAGNOSIS — O411230 Chorioamnionitis, third trimester, not applicable or unspecified: Secondary | ICD-10-CM | POA: Diagnosis present

## 2022-01-20 DIAGNOSIS — O99214 Obesity complicating childbirth: Secondary | ICD-10-CM | POA: Diagnosis present

## 2022-01-20 DIAGNOSIS — Z20822 Contact with and (suspected) exposure to covid-19: Secondary | ICD-10-CM | POA: Diagnosis present

## 2022-01-20 DIAGNOSIS — O10913 Unspecified pre-existing hypertension complicating pregnancy, third trimester: Secondary | ICD-10-CM | POA: Diagnosis not present

## 2022-01-20 DIAGNOSIS — O24410 Gestational diabetes mellitus in pregnancy, diet controlled: Secondary | ICD-10-CM | POA: Diagnosis not present

## 2022-01-20 DIAGNOSIS — Z3490 Encounter for supervision of normal pregnancy, unspecified, unspecified trimester: Secondary | ICD-10-CM | POA: Diagnosis present

## 2022-01-20 DIAGNOSIS — O26899 Other specified pregnancy related conditions, unspecified trimester: Secondary | ICD-10-CM

## 2022-01-20 DIAGNOSIS — O26893 Other specified pregnancy related conditions, third trimester: Secondary | ICD-10-CM | POA: Diagnosis present

## 2022-01-20 DIAGNOSIS — O99210 Obesity complicating pregnancy, unspecified trimester: Secondary | ICD-10-CM | POA: Diagnosis present

## 2022-01-20 DIAGNOSIS — O1002 Pre-existing essential hypertension complicating childbirth: Secondary | ICD-10-CM | POA: Diagnosis present

## 2022-01-20 DIAGNOSIS — O24420 Gestational diabetes mellitus in childbirth, diet controlled: Secondary | ICD-10-CM | POA: Diagnosis present

## 2022-01-20 DIAGNOSIS — O1092 Unspecified pre-existing hypertension complicating childbirth: Secondary | ICD-10-CM | POA: Diagnosis not present

## 2022-01-20 DIAGNOSIS — Z3A39 39 weeks gestation of pregnancy: Secondary | ICD-10-CM | POA: Diagnosis not present

## 2022-01-20 DIAGNOSIS — Z6791 Unspecified blood type, Rh negative: Secondary | ICD-10-CM

## 2022-01-20 DIAGNOSIS — O411290 Chorioamnionitis, unspecified trimester, not applicable or unspecified: Secondary | ICD-10-CM | POA: Diagnosis not present

## 2022-01-20 DIAGNOSIS — O10919 Unspecified pre-existing hypertension complicating pregnancy, unspecified trimester: Secondary | ICD-10-CM

## 2022-01-20 DIAGNOSIS — Z3400 Encounter for supervision of normal first pregnancy, unspecified trimester: Secondary | ICD-10-CM | POA: Diagnosis not present

## 2022-01-20 HISTORY — DX: Unspecified pre-existing hypertension complicating pregnancy, unspecified trimester: O10.919

## 2022-01-20 LAB — GLUCOSE, CAPILLARY: Glucose-Capillary: 132 mg/dL — ABNORMAL HIGH (ref 70–99)

## 2022-01-20 LAB — PROTEIN / CREATININE RATIO, URINE
Creatinine, Urine: 82.39 mg/dL
Protein Creatinine Ratio: 0.17 mg/mg{Cre} — ABNORMAL HIGH (ref 0.00–0.15)
Total Protein, Urine: 14 mg/dL

## 2022-01-20 LAB — RESP PANEL BY RT-PCR (FLU A&B, COVID) ARPGX2
Influenza A by PCR: NEGATIVE
Influenza B by PCR: NEGATIVE
SARS Coronavirus 2 by RT PCR: NEGATIVE

## 2022-01-20 LAB — CBC
HCT: 38.8 % (ref 36.0–46.0)
Hemoglobin: 13.4 g/dL (ref 12.0–15.0)
MCH: 29.6 pg (ref 26.0–34.0)
MCHC: 34.5 g/dL (ref 30.0–36.0)
MCV: 85.7 fL (ref 80.0–100.0)
Platelets: 210 10*3/uL (ref 150–400)
RBC: 4.53 MIL/uL (ref 3.87–5.11)
RDW: 13.5 % (ref 11.5–15.5)
WBC: 13.8 10*3/uL — ABNORMAL HIGH (ref 4.0–10.5)
nRBC: 0 % (ref 0.0–0.2)

## 2022-01-20 LAB — COMPREHENSIVE METABOLIC PANEL
ALT: 16 U/L (ref 0–44)
AST: 21 U/L (ref 15–41)
Albumin: 3.2 g/dL — ABNORMAL LOW (ref 3.5–5.0)
Alkaline Phosphatase: 133 U/L — ABNORMAL HIGH (ref 38–126)
Anion gap: 11 (ref 5–15)
BUN: <5 mg/dL — ABNORMAL LOW (ref 6–20)
CO2: 22 mmol/L (ref 22–32)
Calcium: 8.8 mg/dL — ABNORMAL LOW (ref 8.9–10.3)
Chloride: 104 mmol/L (ref 98–111)
Creatinine, Ser: 0.73 mg/dL (ref 0.44–1.00)
GFR, Estimated: >60 mL/min (ref >60)
Glucose, Bld: 81 mg/dL (ref 70–99)
Potassium: 3.4 mmol/L — ABNORMAL LOW (ref 3.5–5.1)
Sodium: 137 mmol/L (ref 135–145)
Total Bilirubin: 0.4 mg/dL (ref 0.3–1.2)
Total Protein: 6.5 g/dL (ref 6.5–8.1)

## 2022-01-20 LAB — TYPE AND SCREEN
ABO/RH(D): A NEG
Antibody Screen: NEGATIVE

## 2022-01-20 MED ORDER — TERBUTALINE SULFATE 1 MG/ML IJ SOLN: 0.2500 mg | Freq: Once | INTRAMUSCULAR | Status: DC | PRN

## 2022-01-20 MED ORDER — OXYCODONE-ACETAMINOPHEN 5-325 MG PO TABS: 2.0000 | ORAL_TABLET | ORAL | Status: DC | PRN

## 2022-01-20 MED ORDER — SOD CITRATE-CITRIC ACID 500-334 MG/5ML PO SOLN: 30.0000 mL | ORAL | Status: DC | PRN

## 2022-01-20 MED ORDER — OXYTOCIN-SODIUM CHLORIDE 30-0.9 UT/500ML-% IV SOLN: 1.0000 m[IU]/min | INTRAVENOUS | Status: DC

## 2022-01-20 MED ORDER — OXYCODONE-ACETAMINOPHEN 5-325 MG PO TABS: 1.0000 | ORAL_TABLET | ORAL | Status: DC | PRN

## 2022-01-20 MED ORDER — OXYTOCIN BOLUS FROM INFUSION
333.0000 mL | Freq: Once | INTRAVENOUS | Status: AC
  Administered 2022-01-22: 333 mL via INTRAVENOUS

## 2022-01-20 MED ORDER — LACTATED RINGERS IV SOLN: 500.0000 mL | INTRAVENOUS | Status: DC | PRN

## 2022-01-20 MED ORDER — ACETAMINOPHEN 325 MG PO TABS: 650.0000 mg | ORAL_TABLET | ORAL | Status: DC | PRN

## 2022-01-20 MED ORDER — ONDANSETRON HCL 4 MG/2ML IJ SOLN: 4.0000 mg | Freq: Four times a day (QID) | INTRAMUSCULAR | Status: DC | PRN

## 2022-01-20 MED ORDER — OXYTOCIN-SODIUM CHLORIDE 30-0.9 UT/500ML-% IV SOLN
1.0000 m[IU]/min | INTRAVENOUS | Status: DC
  Administered 2022-01-20: 2 m[IU]/min via INTRAVENOUS
  Administered 2022-01-21: 4 m[IU]/min via INTRAVENOUS
  Administered 2022-01-21: 2 m[IU]/min via INTRAVENOUS

## 2022-01-20 MED ORDER — LACTATED RINGERS IV SOLN
INTRAVENOUS | Status: DC
  Administered 2022-01-20 – 2022-01-21 (×2): 950 mL via INTRAVENOUS

## 2022-01-20 MED ORDER — OXYTOCIN-SODIUM CHLORIDE 30-0.9 UT/500ML-% IV SOLN
2.5000 [IU]/h | INTRAVENOUS | Status: DC
  Administered 2022-01-22: 2.5 [IU]/h via INTRAVENOUS
  Filled 2022-01-20 (×2): qty 500

## 2022-01-20 MED ORDER — LIDOCAINE HCL (PF) 1 % IJ SOLN: 30.0000 mL | INTRAMUSCULAR | Status: DC | PRN

## 2022-01-20 NOTE — Progress Notes (Signed)
° °  PRENATAL VISIT NOTE  Subjective:  Crystal Powell is a 26 y.o. G1P0000 at [redacted]w[redacted]d being seen today for ongoing prenatal care.  She is currently monitored for the following issues for this high-risk pregnancy and has Antepartum bleeding, first trimester; Supervision of normal first pregnancy, antepartum; Rh negative state in antepartum period; UTI (urinary tract infection) during pregnancy; Obesity in pregnancy, antepartum; and Gestational diabetes mellitus in pregnancy, diet controlled on their problem list.  Patient reports no complaints.  Contractions: Irritability. Vag. Bleeding: None.  Movement: Present. Denies leaking of fluid.   The following portions of the patient's history were reviewed and updated as appropriate: allergies, current medications, past family history, past medical history, past social history, past surgical history and problem list. Problem list updated.  Objective:   Vitals:   01/20/22 0823  BP: 138/82  Pulse: 93  Weight: 184 lb (83.5 kg)    Fetal Status: Fetal Heart Rate (bpm): 130   Movement: Present     General:  Alert, oriented and cooperative. Patient is in no acute distress.  Skin: Skin is warm and dry. No rash noted.   Cardiovascular: Normal heart rate noted  Respiratory: Normal respiratory effort, no problems with respiration noted  Abdomen: Soft, gravid, appropriate for gestational age.  Pain/Pressure: Absent     Pelvic: Cervical exam performed        Extremities: Normal range of motion.  Edema: Trace  Mental Status:  Normal mood and affect. Normal behavior. Normal judgment and thought content.  Procedure: Patient informed of R/B/A of procedure. Appropriate time out taken. The patient was placed in the lithotomy position and a cervical exam was performed and a finger was used to guide the 84F foley through the internal os of the cervix. Foley Balloon filled with 60cc of sterile water. Plug inserted into end of the foley. Foley placed on tension and  taped to medial thigh.  NST:  EFM Baseline: 130 bpm, Variability: Good {> 6 bpm), Accelerations: Reactive, and Decelerations: Absent  Toco: irregular, every 10 minutes There were no signs of tachysystole or hypertonus. All equipment was removed and accounted for. The patient tolerated the procedure well.  Assessment and Plan:  Pregnancy: G1P0000 at [redacted]w[redacted]d 1. Supervision of normal first pregnancy, antepartum  2. Diet controlled gestational diabetes mellitus (GDM) in third trimester Induce tonight  3. Obesity in pregnancy, antepartum   4. Rh negative state in antepartum period    S/p Outpatient placement of foley balloon catheter for cervical ripening. Induction of labor scheduled for tomorrow at 0000 am. Reassuring FHR tracing with no concerns at present. Warning signs given to patient to include return to MAU for heavy vaginal bleeding, Rupture of membranes, painful uterine contractions q 5 mins or less, severe abdominal discomfort, decreased fetal movement.  No follow-ups on file.   Levie Heritage, DO 01/20/2022 1:17 PM

## 2022-01-20 NOTE — MAU Provider Note (Signed)
History     [id number]  Arrival date and time: 01/20/22 1418    Chief Complaint  Patient presents with   Contractions     HPI Crystal Powell is a 26 y.o. at [redacted]w[redacted]d by 6 week ultrasound with PMHx notable for A1DM & chronic hypertension, who presents for vaginal bleeding. Was seen in office this morning & foley bulb was placed for outpatient ripening. She is scheduled for induction tonight. Notes an increase in bleeding since 10 am. Has seen frank blood coming from the foley & running down her leg. Has had some abdominal cramping.  Reports good fetal movement.  Denies headache, visual disturbance, or epigastric pain.    OB History     Gravida  1   Para  0   Term  0   Preterm  0   AB  0   Living  0      SAB  0   IAB  0   Ectopic  0   Multiple  0   Live Births  0           Past Medical History:  Diagnosis Date   Diabetes mellitus without complication ([place])     Past Surgical History:  Procedure Laterality Date   NO PAST SURGERIES      Family History  Problem Relation Age of Onset   Diabetes Maternal Grandmother    Diabetes Maternal Grandfather     Allergies  Allergen Reactions   Peanut-Containing Drug Products     No current facility-administered medications on file prior to encounter.   Current Outpatient Medications on File Prior to Encounter  Medication Sig Dispense Refill   Accu-Chek Softclix Lancets lancets DX: O24.419. Check BS QID. 100 each 12   aspirin EC 81 MG tablet Take 81 mg by mouth daily. Swallow whole.     Blood Glucose Monitoring Suppl (ACCU-CHEK GUIDE) w/Device KIT DX: O24.419. Check BS QID. 1 kit 0   glucose blood test strip DX: O24.419. Check BS QID. 100 each 12   Prenatal Vit-Fe Fumarate-FA (PRENATAL VITAMINS PO) Take by mouth.       ROS Pertinent positives and negative per HPI, all others reviewed and negative  Physical Exam   BP 133/76    Pulse 97    Temp 98.4 F (36.9 C) (Oral)    Resp 17    Ht 5' (1.524 m)     Wt 83.8 kg    LMP 04/10/2021    SpO2 98%    BMI 36.07 kg/m   Patient Vitals for the past 24 hrs:  BP Temp Temp src Pulse Resp SpO2 Height Weight  01/20/22 1516 133/76 -- -- 97 -- -- -- --  01/20/22 1509 (!) 144/76 -- -- (!) 104 -- 98 % -- --  01/20/22 1454 (!) 151/80 98.4 F (36.9 C) Oral 98 17 100 % -- --  01/20/22 1451 -- -- -- -- -- -- 5' (1.524 m) 83.8 kg    Physical Exam Vitals and nursing note reviewed. Exam conducted with a chaperone present.  Constitutional:      General: She is not in acute distress.    Appearance: Normal appearance.  Pulmonary:     Effort: Pulmonary effort is normal. No respiratory distress.  Genitourinary:    Comments: Foley bulb outside of cervix on exam, removed.   Skin:    General: Skin is warm and dry.  Neurological:     Mental Status: She is alert.  Psychiatric:  Mood and Affect: Mood normal.        Behavior: Behavior normal.     Cervical Exam Dilation: 4.5 Effacement (%): 50 Cervical Position: Middle Station: -3, Ballotable Presentation: Vertex Exam by:: Jorje Guild NP  Bedside Ultrasound Pt informed that the ultrasound is considered a limited OB ultrasound and is not intended to be a complete ultrasound exam.  Patient also informed that the ultrasound is not being completed with the intent of assessing for fetal or placental anomalies or any pelvic abnormalities.  Explained that the purpose of todays ultrasound is to assess for  presentation.  Patient acknowledges the purpose of the exam and the limitations of the study.     My interpretation: cephalic  FHT Baseline 161, moderate variability, 15x15 accels, no decels Toco: Q 2-10 minutes Cat: 1  Labs No results found for this or any previous visit (from the past 24 hour(s)).  Imaging No results found.  MAU Course  Procedures Lab Orders         CBC         RPR         Comprehensive metabolic panel         Protein / creatinine ratio, urine    No orders of the  defined types were placed in this encounter.  Imaging Orders  No imaging studies ordered today    MDM Foley bulb out Elevated BPs in MAU. Likely has chronic hypertension  Assessment and Plan   1. Diet controlled gestational diabetes mellitus (GDM) in third trimester   2. Chronic hypertension complicating or reason for care during pregnancy, third trimester   3. [redacted] weeks gestation of pregnancy    -Admit for induction   Jorje Guild, NP 01/20/22 3:42 PM

## 2022-01-20 NOTE — H&P (Signed)
OBSTETRIC ADMISSION HISTORY AND PHYSICAL  Crystal Powell is a 26 y.o. female G1P0000 with IUP at 43w2dpresenting for IOL for A1GDM. She reports +FMs. No LOF, VB, blurry vision, headaches, peripheral edema, or RUQ pain. She plans on breastfeeding. She requests Nexplanon for birth control.  Dating: By 6[place]an [place]--->  Estimated Date of Delivery: 01/25/22  Sono:    _0 , normal anatomy, ceph presentation, 3366g, 76%ile, EFW 7'7   Prenatal History/Complications: -obesity -A1GDM -CHTN -UTI in pregnancy -Rh negative  Past Medical History: Past Medical History:  Diagnosis Date   Diabetes mellitus without complication ([place]     Past Surgical History: Past Surgical History:  Procedure Laterality Date   NO PAST SURGERIES      Obstetrical History: OB History     Gravida  1   Para  0   Term  0   Preterm  0   AB  0   Living  0      SAB  0   IAB  0   Ectopic  0   Multiple  0   Live Births  0           Social History: Social History   Socioeconomic History   Marital status: Married    Spouse name: Not on file   Number of children: Not on file   Years of education: Not on file   Highest education level: Not on file  Occupational History   Not on file  Tobacco Use   Smoking status: Never   Smokeless tobacco: Never  Vaping Use   Vaping Use: Never used  Substance and Sexual Activity   Alcohol use: Never   Drug use: Never   Sexual activity: Yes    Birth control/protection: None  Other Topics Concern   Not on file  Social History Narrative   Not on file   Social Determinants of Health   Financial Resource Strain: Not on file  Food Insecurity: No Food Insecurity   Worried About RCharity fundraiserin the Last Year: Never true   Ran Out of Food in the Last Year: Never true  Transportation Needs: Not on file  Physical Activity: Not on file  Stress: Not on file  Social Connections: Not on file    Family History: Family History  Problem  Relation Age of Onset   Diabetes Maternal Grandmother    Diabetes Maternal Grandfather     Allergies: Allergies  Allergen Reactions   Peanut-Containing Drug Products     Medications Prior to Admission  Medication Sig Dispense Refill Last Dose   Accu-Chek Softclix Lancets lancets DX: O24.419. Check BS QID. 100 each 12    aspirin EC 81 MG tablet Take 81 mg by mouth daily. Swallow whole.      Blood Glucose Monitoring Suppl (ACCU-CHEK GUIDE) w/Device KIT DX: O24.419. Check BS QID. 1 kit 0    glucose blood test strip DX: O24.419. Check BS QID. 100 each 12    Prenatal Vit-Fe Fumarate-FA (PRENATAL VITAMINS PO) Take by mouth.        Review of Systems:  All systems reviewed and negative except as stated in HPI  PE: Blood pressure 133/76, pulse 97, temperature 98.4 F (36.9 C), temperature source Oral, resp. rate 17, height 5' (1.524 m), weight 83.8 kg, last menstrual period 04/10/2021, SpO2 98 %. General appearance: alert, cooperative, and no distress Lungs: regular rate and effort Heart: regular rate  Abdomen: soft, non-tender Extremities: Homans sign is negative, no sign  of DVT Presentation: cephalic EFM: 563 bpm, mod variability, + accels, no decels Toco: 2-7 Dilation: 4.5 Effacement (%): 50 Station: -3 Exam by:: erin lawrence np  Prenatal labs: ABO, Rh: A/Negative/-- (08/04 0859) Antibody: Positive, See Final Results (08/04 0859) Rubella: 7.68 (08/04 0859) RPR: Non Reactive (11/22 0909)  HBsAg: Negative (08/04 0859)  HIV: Non Reactive (11/22 0909)  GBS: Negative/-- (02/02 0844)  2 hr GTT 85/185/134  Prenatal Transfer Tool  Maternal Diabetes: Yes:  Diabetes Type:  Diet controlled Genetic Screening: Normal Maternal Ultrasounds/Referrals: Normal Fetal Ultrasounds or other Referrals:  Referred to Materal Fetal Medicine  Maternal Substance Abuse:  No Significant Maternal Medications:  None Significant Maternal Lab Results: Group B Strep negative  No results found for  this or any previous visit (from the past 24 hour(s)).  Patient Active Problem List   Diagnosis Date Noted   Gestational diabetes mellitus in pregnancy, diet controlled 10/21/2021   Obesity in pregnancy, antepartum 10/19/2021   UTI (urinary tract infection) during pregnancy 07/06/2021   Supervision of normal first pregnancy, antepartum 07/01/2021   Rh negative state in antepartum period 07/01/2021    Assessment: Crystal Powell is a 26 y.o. G1P0000 at 5w2dhere for IOL for GDM  1. Labor: latent 2. FWB: Cat I 3. Pain: analgesia/anesthesia 4. GBS: neg   Plan: Admit to LD IOL with PBertha CNM  01/20/2022, 3:34 PM

## 2022-01-20 NOTE — MAU Note (Signed)
Foley bulb removed by Judeth Horn NP during VE.

## 2022-01-20 NOTE — MAU Note (Signed)
...  Crystal Powell is a 26 y.o. at [redacted]w[redacted]d here in MAU reporting: CTX that are 5 minutes apart. She states she had a foley bulb placed in office this morning with Dr. Adrian Blackwater, DO. +FM. Endorses vaginal bleeding that is coming out of the foley bulb that is dripping down her leg since 1015 this morning.   They have been watching her BP's in office. Taking 81 mg ASA. Scheduled for a midnight induction tonight.  Pain score:  5/10 lower abdomen  FHT: 144 initial external

## 2022-01-21 ENCOUNTER — Inpatient Hospital Stay (HOSPITAL_COMMUNITY): Payer: No Typology Code available for payment source | Admitting: Anesthesiology

## 2022-01-21 ENCOUNTER — Inpatient Hospital Stay (HOSPITAL_COMMUNITY)
Admission: AD | Admit: 2022-01-21 | Payer: No Typology Code available for payment source | Source: Home / Self Care | Admitting: Family Medicine

## 2022-01-21 ENCOUNTER — Inpatient Hospital Stay (HOSPITAL_COMMUNITY): Payer: No Typology Code available for payment source

## 2022-01-21 LAB — GLUCOSE, CAPILLARY
Glucose-Capillary: 80 mg/dL (ref 70–99)
Glucose-Capillary: 85 mg/dL (ref 70–99)
Glucose-Capillary: 105 mg/dL — ABNORMAL HIGH (ref 70–99)
Glucose-Capillary: 88 mg/dL (ref 70–99)
Glucose-Capillary: 97 mg/dL (ref 70–99)

## 2022-01-21 LAB — CBC WITH DIFFERENTIAL/PLATELET
Abs Immature Granulocytes: 0.05 10*3/uL (ref 0.00–0.07)
Basophils Absolute: 0 10*3/uL (ref 0.0–0.1)
Basophils Relative: 0 %
Eosinophils Absolute: 0 10*3/uL (ref 0.0–0.5)
Eosinophils Relative: 0 %
HCT: 36.3 % (ref 36.0–46.0)
Hemoglobin: 12.4 g/dL (ref 12.0–15.0)
Immature Granulocytes: 0 %
Lymphocytes Relative: 18 %
Lymphs Abs: 2.2 10*3/uL (ref 0.7–4.0)
MCH: 29.5 pg (ref 26.0–34.0)
MCHC: 34.2 g/dL (ref 30.0–36.0)
MCV: 86.4 fL (ref 80.0–100.0)
Monocytes Absolute: 1 10*3/uL (ref 0.1–1.0)
Monocytes Relative: 8 %
Neutro Abs: 9.4 10*3/uL — ABNORMAL HIGH (ref 1.7–7.7)
Neutrophils Relative %: 74 %
Platelets: 184 10*3/uL (ref 150–400)
RBC: 4.2 MIL/uL (ref 3.87–5.11)
RDW: 13.4 % (ref 11.5–15.5)
WBC: 12.7 10*3/uL — ABNORMAL HIGH (ref 4.0–10.5)
nRBC: 0 % (ref 0.0–0.2)

## 2022-01-21 LAB — RPR: RPR Ser Ql: NONREACTIVE

## 2022-01-21 MED ORDER — ACETAMINOPHEN 500 MG PO TABS
1000.0000 mg | ORAL_TABLET | Freq: Once | ORAL | Status: AC
  Administered 2022-01-21: 1000 mg via ORAL
  Filled 2022-01-21: qty 2

## 2022-01-21 MED ORDER — EPHEDRINE 5 MG/ML INJ: 10.0000 mg | INTRAVENOUS | Status: DC | PRN

## 2022-01-21 MED ORDER — FENTANYL-BUPIVACAINE-NACL 0.5-0.125-0.9 MG/250ML-% EP SOLN
12.0000 mL/h | EPIDURAL | Status: DC | PRN
  Administered 2022-01-21 (×2): 12 mL/h via EPIDURAL
  Filled 2022-01-21 (×2): qty 250

## 2022-01-21 MED ORDER — PHENYLEPHRINE 40 MCG/ML (10ML) SYRINGE FOR IV PUSH (FOR BLOOD PRESSURE SUPPORT): 80.0000 ug | PREFILLED_SYRINGE | INTRAVENOUS | Status: DC | PRN

## 2022-01-21 MED ORDER — LIDOCAINE-EPINEPHRINE (PF) 1.5 %-1:200000 IJ SOLN
INTRAMUSCULAR | Status: DC | PRN
  Administered 2022-01-21: 5 mL via PERINEURAL

## 2022-01-21 MED ORDER — LACTATED RINGERS IV SOLN
500.0000 mL | Freq: Once | INTRAVENOUS | Status: AC
  Administered 2022-01-21: 500 mL via INTRAVENOUS

## 2022-01-21 MED ORDER — GENTAMICIN SULFATE 40 MG/ML IJ SOLN
5.0000 mg/kg | Freq: Once | INTRAVENOUS | Status: AC
  Administered 2022-01-21: 300 mg via INTRAVENOUS
  Filled 2022-01-21: qty 7.5

## 2022-01-21 MED ORDER — SODIUM CHLORIDE 0.9 % IV SOLN
2.0000 g | Freq: Once | INTRAVENOUS | Status: AC
  Administered 2022-01-21: 2 g via INTRAVENOUS
  Filled 2022-01-21: qty 2000

## 2022-01-21 MED ORDER — LIDOCAINE HCL (PF) 1 % IJ SOLN
INTRAMUSCULAR | Status: DC | PRN
  Administered 2022-01-21: 5 mL via EPIDURAL

## 2022-01-21 MED ORDER — DIPHENHYDRAMINE HCL 50 MG/ML IJ SOLN: 12.5000 mg | INTRAMUSCULAR | Status: DC | PRN

## 2022-01-21 NOTE — Progress Notes (Signed)
Crystal Powell is a 26 y.o. G1P0000 at [redacted]w[redacted]d by LMP admitted for induction of labor due to A1DM and cHTN (well controlled without meds).  Subjective: Pt doing well, no questions or concerns.  Just finished vomiting. Denies HA, vision changes, or RUQ pain.   Objective: Pt resting in upright position, accompanied by husband and mother. NAD. BP 137/85    Pulse 98    Temp 98.5 F (36.9 C) (Axillary)    Resp 16    Ht 5' (1.524 m)    Wt 83.8 kg    LMP 04/10/2021    SpO2 99%    BMI 36.07 kg/m  No intake/output data recorded. Total I/O In: -  Out: 850 [Urine:850]  FHT:  FHR: 140's bpm, variability: minimal ,  accelerations:  Present,  decelerations:  Present early UC:   regular, every 3-5 minutes SVE:   Dilation: 6 Effacement (%): 70 Station: -2 Exam by:: Diannia Ruder, RN  Labs: Lab Results  Component Value Date   WBC 12.7 (H) 01/21/2022   HGB 12.4 01/21/2022   HCT 36.3 01/21/2022   MCV 86.4 01/21/2022   PLT 184 01/21/2022    Assessment / Plan: Induction of labor due to gestational diabetes and cHTN,  progressing well on pitocin. Slow cervical change since 0124.  At next check, plan IUPC if no cervical change.  cHTN:   140/80's and pt asymptomatic. Labs stable. UPCR:0.17.  A1DM: CBG's normal range. Continue q4 CBG's.  Fetal Wellbeing:  Category I Pain Control:  Epidural I/D:   GBS negative Anticipated MOD:  NSVD  Adia Crammer 01/21/2022, 10:26 AM

## 2022-01-21 NOTE — Progress Notes (Signed)
Labor Progress Note Crystal Powell is a 26 y.o. G1P0000 at [redacted]w[redacted]d who presented for IOL due to A1GDM, cHTN.  S: Doing well. Feeling her contractions more on Pitocin. Considering epidural soon. Family at bedside. No concerns.   O:  BP 140/79    Pulse 84    Temp 98.5 F (36.9 C) (Oral)    Resp 16    Ht 5' (1.524 m)    Wt 83.8 kg    LMP 04/10/2021    SpO2 99%    BMI 36.07 kg/m   EFM: Baseline 130 bpm, moderate variability, + accels, early decels  Toco: Every 1-3 minutes   CVE: Dilation: 4 Effacement (%): 60 Cervical Position: Posterior Station: -3 Presentation: Vertex Exam by:: Ulis Rias, RN   A&P: 26 y.o. G1P0000 [redacted]w[redacted]d   #Labor: Progressing well. Pitocin at 12 milli-units/min. Fetal head well applied with bulging bag. Discussed AROM and patient verbally consented. AROM performed with clear fluid. Mom and baby tolerated well. Will continue Pitocin and reassess in 4 hours.  #Pain: PRN; planing for epidural soon  #FWB: Cat 1  #GBS negative  #A1GDM: CBGs stable. Will continue q4hr glucose checks.   #cHTN: Normal to mild range pressures. No symptoms. Pre-E labs normal on admission. Will continue to monitor.   Worthy Rancher, MD 2:08 AM

## 2022-01-21 NOTE — Anesthesia Preprocedure Evaluation (Signed)
Anesthesia Evaluation  Patient identified by MRN, date of birth, ID band Patient awake    Reviewed: Allergy & Precautions, H&P , NPO status , Patient's Chart, lab work & pertinent test results  History of Anesthesia Complications Negative for: history of anesthetic complications  Airway Mallampati: II  TM Distance: >3 FB Neck ROM: full    Dental no notable dental hx. (+) Teeth Intact   Pulmonary neg pulmonary ROS,    Pulmonary exam normal breath sounds clear to auscultation       Cardiovascular hypertension (Gestational), Normal cardiovascular exam Rhythm:regular Rate:Normal     Neuro/Psych negative neurological ROS  negative psych ROS   GI/Hepatic negative GI ROS, Neg liver ROS,   Endo/Other  diabetes, Gestational  Renal/GU negative Renal ROS  negative genitourinary   Musculoskeletal   Abdominal (+) + obese,   Peds  Hematology negative hematology ROS (+)   Anesthesia Other Findings   Reproductive/Obstetrics (+) Pregnancy                             Anesthesia Physical Anesthesia Plan  ASA: 2  Anesthesia Plan: Epidural   Post-op Pain Management:    Induction:   PONV Risk Score and Plan:   Airway Management Planned:   Additional Equipment:   Intra-op Plan:   Post-operative Plan:   Informed Consent: I have reviewed the patients History and Physical, chart, labs and discussed the procedure including the risks, benefits and alternatives for the proposed anesthesia with the patient or authorized representative who has indicated his/her understanding and acceptance.       Plan Discussed with:   Anesthesia Plan Comments:         Anesthesia Quick Evaluation

## 2022-01-21 NOTE — Progress Notes (Addendum)
S: Patient comfortable in bed resting. Patient has no questions or concerns at this time. Significant other at bedside.   O: Vitals:   01/21/22 1901 01/21/22 1930 01/21/22 2000 01/21/22 2023  BP: 139/62 (!) 143/78 (!) 145/85   Pulse: 95 (!) 102 99   Resp:   16   Temp:    (!) 100.9 F (38.3 C)  TempSrc:    Axillary  SpO2:      Weight:      Height:        FHT:  FHR: 155 bpm, variability: moderate,  accelerations:  Present,  decelerations:  Absent UC:   irregular, every 2-5 minutes SVE:   Dilation: Lip/rim Effacement (%): 100 Station: -1, 0 Exam by:: Ricardo Jericho, RN  A / P: 26 y.o. G1P0 at [redacted]w[redacted]d here for IOL d/t A1GDM and cHTN (no meds) progressing well  -cHTN- Bp's 140s-80s, pt asymptomatic  -new dx Triple I- Temp 100.9, 1 gram Tylenol and Ampicillin and Gentamicin. Will continue to monitor  -A1GDM- CBG's (73,42,876) -Plan to restart pitocin for adequate ctxs  -GBS negative Fetal Wellbeing:  Category I Pain Control:  Epidural Anticipated MOD:  NSVD  Derryl Harbor 01/21/2022, 8:59 PM

## 2022-01-21 NOTE — Progress Notes (Signed)
Crystal Powell is a 26 y.o. G1P0000 at [redacted]w[redacted]d by LMP admitted for induction of labor due to A1DM and cHTN (well controlled without meds).  Subjective: Pt doing well, no questions or concerns.  She reports feeling increased vaginal pressure, no urge to push. She is concerned that she hasn't delivered.   Objective: Pt resting in left lateral,  husband and mother remains at bedside.  BP (!) 145/84    Pulse (!) 102    Temp 98.5 F (36.9 C) (Axillary)    Resp 16    Ht 5' (1.524 m)    Wt 83.8 kg    LMP 04/10/2021    SpO2 99%    BMI 36.07 kg/m  No intake/output data recorded. Total I/O In: -  Out: 850 [Urine:850]  FHT:  FHR: 150's bpm, variability: minimal ,  accelerations:  Present,  decelerations:  Present early UC:   regular, every 1-4 minutes SVE:   Dilation: 6 Effacement (%): 90 Station: -2 Exam by:: Olga Coaster, CNM Student  Labs: Lab Results  Component Value Date   WBC 12.7 (H) 01/21/2022   HGB 12.4 01/21/2022   HCT 36.3 01/21/2022   MCV 86.4 01/21/2022   PLT 184 01/21/2022    Assessment / Plan: IOL d/t A1DM and cHTN . Slow cervical change since 0124 and no change at this time. Protracted latent labor: IUPC placed.   cHTN:   130/70's and pt asymptomatic. A1DM: CBG's-105. Continue q4 CBG's.  Fetal Wellbeing:  Category I Pain Control:  Epidural I/D:   GBS negative Anticipated MOD:  NSVD  Eleanor Gatliff 01/21/2022, 12:13 PM

## 2022-01-21 NOTE — Progress Notes (Signed)
Labor Progress Note Crystal Powell is a 26 y.o. G1P0000 at [redacted]w[redacted]d who presented for IOL due to A1GDM, [place].  S: Doing well. No concerns.   O:  BP 129/76    Pulse 99    Temp 99.5 F (37.5 C) (Oral)    Resp 16    Ht 5' (1.524 m)    Wt 83.8 kg    LMP 04/10/2021    SpO2 99%    BMI 36.07 kg/m   EFM: Baseline 145 bpm, moderate variability, + accels, early decels  Toco: Every 1-4 minutes   CVE: Dilation: 6 Effacement (%): 70 Cervical Position: Posterior Station: -2 Presentation: Vertex Exam by:: Nilsa Nutting, RN   A&P: 26 y.o. G1P0000 [redacted]w[redacted]d   #Labor: Progressing well. Will continue to up titrate Pitocin. Plan to reassess in 2-3 hours.  #Pain: Epidural  #FWB: Cat 1  #GBS negative  #A1GDM: CBGs within normal range. Will transition to q2hr glucose checks.   #cHTN: BPs most recently all within normal range. Will continue to monitor.   Genia Del, MD 7:59 AM

## 2022-01-21 NOTE — Progress Notes (Signed)
Crystal Powell is a 26 y.o. G1P0000 at [redacted]w[redacted]d by LMP admitted for induction of labor due to A1DM and cHTN (well controlled without meds).  Subjective: Pt doing well. Reports pain in the lower abdomen has resolved. She experience increased pain in the lower abdomen around 1800.   Objective: Pt resting in high fowlers. Husband and mother remains at bedside.  BP (!) 150/82    Pulse (!) 102    Temp 98.7 F (37.1 C) (Oral)    Resp 16    Ht 5' (1.524 m)    Wt 83.8 kg    LMP 04/10/2021    SpO2 99%    BMI 36.07 kg/m  No intake/output data recorded. Total I/O In: -  Out: 1875 [Urine:1875]   FHT:  FHR: 150's bpm, variability: minimal ,  accelerations:  Present,  decelerations:  Absent UC:   Internal monitor-regular, every 1.5-3 minutes, 60-110 sec, palpates moderate, relaxed between contractions. Leopold's: fetal back maternal right.   SVE:   Dilation: 9 Effacement (%): 100 Station: -1 Exam by:: Crystal Powell, CNM [employer]  Labs: Lab Results  Component Value Date   WBC 12.7 (H) 01/21/2022   HGB 12.4 01/21/2022   HCT 36.3 01/21/2022   MCV 86.4 01/21/2022   PLT 184 01/21/2022    Assessment / Plan: IOL d/t A1DM and cHTN.  Pitocin d/c'd at 1810 given elevated resting tone and tachysystole.   MVU's 40-120. Resting tone decreasing to 50's.  cHTN:   150/80's and pt asymptomatic. A1DM: CBG's-97. Continue q4 CBG's.  Fetal Wellbeing:  Category I Pain Control:  Epidural I/D:   GBS negative Anticipated MOD:  NSVD  Crystal Powell 01/21/2022, 6:58 PM

## 2022-01-21 NOTE — Anesthesia Procedure Notes (Signed)
Epidural Patient location during procedure: OB Start time: 01/21/2022 3:05 AM End time: 01/21/2022 3:15 AM  Staffing Anesthesiologist: Leonides Grills, MD Performed: anesthesiologist   Preanesthetic Checklist Completed: patient identified, IV checked, site marked, risks and benefits discussed, monitors and equipment checked, pre-op evaluation and timeout performed  Epidural Patient position: sitting Prep: DuraPrep Patient monitoring: heart rate, cardiac monitor, continuous pulse ox and blood pressure Approach: midline Location: L4-L5 Injection technique: LOR air  Needle:  Needle type: Tuohy  Needle gauge: 17 G Needle length: 9 cm Needle insertion depth: 7 cm Catheter type: closed end flexible Catheter size: 19 Gauge Catheter at skin depth: 12 cm Test dose: negative and 1.5% lidocaine with Epi 1:200 K  Assessment Events: blood not aspirated, injection not painful, no injection resistance and negative IV test  Additional Notes Informed consent obtained prior to proceeding including risk of failure, 1% risk of PDPH, risk of minor discomfort and bruising. Discussed alternatives to epidural analgesia and patient desires to proceed.  Timeout performed pre-procedure verifying patient name, procedure, and platelet count.  Patient tolerated procedure well. Reason for block:procedure for pain

## 2022-01-21 NOTE — Progress Notes (Signed)
Crystal Powell is a 26 y.o. G1P0000 at [redacted]w[redacted]d by LMP admitted for induction of labor due to A1DM and cHTN (well controlled without meds).  Subjective: Pt doing well, no questions or concerns.  Continues to feel  increased vaginal pressure.  Objective: Pt resting in high fowlers. Husband and mother remains at bedside.  BP 114/69    Pulse (!) 112    Temp 98.5 F (36.9 C) (Axillary)    Resp 16    Ht 5' (1.524 m)    Wt 83.8 kg    LMP 04/10/2021    SpO2 99%    BMI 36.07 kg/m  No intake/output data recorded. Total I/O In: -  Out: 1875 [Urine:1875]  FHT:  FHR: 150's bpm, variability: moderate,  accelerations:  Present,  decelerations:  Absent UC:   Internal monitor-regular, every 1.5-2 minutes, 60-110 sec, palpates moderate, relaxed between contractions.  SVE:   Dilation: 7 Effacement (%): 100 Station: -2 Exam by:: Crystal Powell, CNM  Labs: Lab Results  Component Value Date   WBC 12.7 (H) 01/21/2022   HGB 12.4 01/21/2022   HCT 36.3 01/21/2022   MCV 86.4 01/21/2022   PLT 184 01/21/2022    Assessment / Plan: IOL d/t A1DM and cHTN  On pitocin progressing well, IUPC adjusted, flushed and resting tone lower.  Given cervical progress, will not replace at this time. MVU's 200-220.  cHTN:   140/70-150/80's and pt asymptomatic. A1DM: CBG's-88. Continue q4 CBG's.  Fetal Wellbeing:  Category I Pain Control:  Epidural I/D:   GBS negative Anticipated MOD:  NSVD  Crystal Powell 01/21/2022, 3:42 PM

## 2022-01-22 ENCOUNTER — Encounter (HOSPITAL_COMMUNITY): Payer: Self-pay | Admitting: Family Medicine

## 2022-01-22 DIAGNOSIS — Z3A39 39 weeks gestation of pregnancy: Secondary | ICD-10-CM

## 2022-01-22 DIAGNOSIS — O411290 Chorioamnionitis, unspecified trimester, not applicable or unspecified: Secondary | ICD-10-CM | POA: Diagnosis not present

## 2022-01-22 DIAGNOSIS — O411230 Chorioamnionitis, third trimester, not applicable or unspecified: Secondary | ICD-10-CM | POA: Diagnosis not present

## 2022-01-22 DIAGNOSIS — O24420 Gestational diabetes mellitus in childbirth, diet controlled: Secondary | ICD-10-CM | POA: Diagnosis not present

## 2022-01-22 DIAGNOSIS — O1092 Unspecified pre-existing hypertension complicating childbirth: Secondary | ICD-10-CM | POA: Diagnosis not present

## 2022-01-22 LAB — CBC
HCT: 37 % (ref 36.0–46.0)
Hemoglobin: 13.3 g/dL (ref 12.0–15.0)
MCH: 30.7 pg (ref 26.0–34.0)
MCHC: 35.9 g/dL (ref 30.0–36.0)
MCV: 85.5 fL (ref 80.0–100.0)
Platelets: 202 10*3/uL (ref 150–400)
RBC: 4.33 MIL/uL (ref 3.87–5.11)
RDW: 13.4 % (ref 11.5–15.5)
WBC: 23.3 10*3/uL — ABNORMAL HIGH (ref 4.0–10.5)
nRBC: 0 % (ref 0.0–0.2)

## 2022-01-22 LAB — GLUCOSE, CAPILLARY: Glucose-Capillary: 92 mg/dL (ref 70–99)

## 2022-01-22 MED ORDER — ZOLPIDEM TARTRATE 5 MG PO TABS: 5.0000 mg | ORAL_TABLET | Freq: Every evening | ORAL | Status: DC | PRN

## 2022-01-22 MED ORDER — SENNOSIDES-DOCUSATE SODIUM 8.6-50 MG PO TABS: 2.0000 | ORAL_TABLET | Freq: Every day | ORAL | Status: DC

## 2022-01-22 MED ORDER — WITCH HAZEL-GLYCERIN EX PADS: 1.0000 "application " | MEDICATED_PAD | CUTANEOUS | Status: DC | PRN

## 2022-01-22 MED ORDER — RHO D IMMUNE GLOBULIN 1500 UNIT/2ML IJ SOSY
300.0000 ug | PREFILLED_SYRINGE | Freq: Once | INTRAMUSCULAR | Status: AC
  Administered 2022-01-22: 300 ug via INTRAVENOUS
  Filled 2022-01-22: qty 2

## 2022-01-22 MED ORDER — ACETAMINOPHEN 325 MG PO TABS: 650.0000 mg | ORAL_TABLET | ORAL | Status: DC | PRN

## 2022-01-22 MED ORDER — TETANUS-DIPHTH-ACELL PERTUSSIS 5-2.5-18.5 LF-MCG/0.5 IM SUSY: 0.5000 mL | PREFILLED_SYRINGE | Freq: Once | INTRAMUSCULAR | Status: DC

## 2022-01-22 MED ORDER — BENZOCAINE-MENTHOL 20-0.5 % EX AERO
1.0000 "application " | INHALATION_SPRAY | CUTANEOUS | Status: DC | PRN
  Administered 2022-01-22: 1 via TOPICAL
  Filled 2022-01-22: qty 56

## 2022-01-22 MED ORDER — PRENATAL MULTIVITAMIN CH
1.0000 | ORAL_TABLET | Freq: Every day | ORAL | Status: DC
  Administered 2022-01-22: 1 via ORAL
  Filled 2022-01-22: qty 1

## 2022-01-22 MED ORDER — DIBUCAINE (PERIANAL) 1 % EX OINT
1.0000 | TOPICAL_OINTMENT | CUTANEOUS | Status: DC | PRN
Start: 2022-01-22 — End: 2022-01-23

## 2022-01-22 MED ORDER — ONDANSETRON HCL 4 MG PO TABS
4.0000 mg | ORAL_TABLET | ORAL | Status: DC | PRN
Start: 2022-01-22 — End: 2022-01-23

## 2022-01-22 MED ORDER — ONDANSETRON HCL 4 MG/2ML IJ SOLN: 4.0000 mg | INTRAMUSCULAR | Status: DC | PRN

## 2022-01-22 MED ORDER — IBUPROFEN 600 MG PO TABS
600.0000 mg | ORAL_TABLET | Freq: Four times a day (QID) | ORAL | Status: DC
  Administered 2022-01-22 – 2022-01-23 (×5): 600 mg via ORAL
  Filled 2022-01-22 (×5): qty 1

## 2022-01-22 MED ORDER — COCONUT OIL OIL: 1.0000 "application " | TOPICAL_OIL | Status: DC | PRN

## 2022-01-22 MED ORDER — DIPHENHYDRAMINE HCL 25 MG PO CAPS: 25.0000 mg | ORAL_CAPSULE | Freq: Four times a day (QID) | ORAL | Status: DC | PRN

## 2022-01-22 MED ORDER — OXYCODONE HCL 5 MG PO TABS: 5.0000 mg | ORAL_TABLET | ORAL | Status: DC | PRN

## 2022-01-22 MED ORDER — SIMETHICONE 80 MG PO CHEW
80.0000 mg | CHEWABLE_TABLET | ORAL | Status: DC | PRN
  Filled 2022-01-22: qty 1

## 2022-01-22 NOTE — Anesthesia Postprocedure Evaluation (Signed)
Anesthesia Post Note  Patient: Crystal Powell  Procedure(s) Performed: AN AD HOC LABOR EPIDURAL     Patient location during evaluation: Mother Baby Anesthesia Type: Epidural Level of consciousness: awake and alert Pain management: pain level controlled Vital Signs Assessment: post-procedure vital signs reviewed and stable Respiratory status: spontaneous breathing, nonlabored ventilation and respiratory function stable Cardiovascular status: stable Postop Assessment: no headache, no backache and epidural receding Anesthetic complications: no   No notable events documented.  Last Vitals:  Vitals:   01/22/22 0728 01/22/22 0828  BP: 136/87 118/74  Pulse:  93  Resp:  18  Temp:  37.1 C  SpO2:  98%    Last Pain:  Vitals:   01/22/22 0828  TempSrc: Oral  PainSc: 3    Pain Goal: Patients Stated Pain Goal: 6 (01/20/22 2312)              Epidural/Spinal Function Cutaneous sensation: Normal sensation (01/22/22 0828), Patient able to flex knees: Yes (01/22/22 4782), Patient able to lift hips off bed: Yes (01/22/22 0828), Back pain beyond tenderness at insertion site: No (01/22/22 0828), Progressively worsening motor and/or sensory loss: No (01/22/22 0828), Bowel and/or bladder incontinence post epidural: No (01/22/22 9562)  [facility] Records

## 2022-01-22 NOTE — Discharge Summary (Signed)
Postpartum Discharge Summary  Date of Service updated 01/23/22     Patient Name: Crystal Powell DOB: May 18, 1996 MRN: [id number]  Date of admission: 01/20/2022 Delivery date:01/22/2022  Delivering provider: WHITE, Ubaldo Glassing D  Date of discharge: 01/23/2022  Admitting diagnosis: Encounter for induction of labor [Z34.90] Intrauterine pregnancy: [redacted]w[redacted]d     Secondary diagnosis:  Principal Problem:   Encounter for induction of labor Active Problems:   Rh negative state in antepartum period   Obesity in pregnancy, antepartum   Gestational diabetes mellitus in pregnancy, diet controlled   Chronic hypertension affecting pregnancy   Chorioamnionitis  Additional problems: none    Discharge diagnosis: Term Pregnancy Delivered, CHTN, and GDM A1                                              Post partum procedures: none Augmentation: AROM, Pitocin, and OP Foley Complications: None  Hospital course: Induction of Labor With Vaginal Delivery   26 y.o. yo G1P1001 at [redacted]w[redacted]d was admitted to the hospital 01/20/2022 for induction of labor.  Indication for induction: A1 DM and then also noted to have had intermittent elevated BP readings since 14wks (cHTN) .  She had neg pre-e labs and was asymptomatic, not requiring BP meds. Patient had a labor course remarkable for developing Triple I and receiving a dose each of Amp/Gent.  Membrane Rupture Time/Date: 1:24 AM ,01/21/2022   Delivery Method:Vaginal, Spontaneous  Episiotomy: None  Lacerations:  1st degree;Vaginal  Details of delivery can be found in separate delivery note.  Patient had a routine postpartum course.  Her BPs remained wnl Patient is discharged home 01/23/22.  Newborn Data: Birth date:01/22/2022  Birth time:12:57 AM  Gender:Female  Living status:Living  Apgars:5 ,7  Weight:3500 g (7lb 11.5oz)  Magnesium Sulfate received: No BMZ received: No Rhophylac:Yes MMR:N/A T-DaP:Given prenatally Flu: Yes Transfusion:No  Physical exam   Vitals:   01/22/22 0828 01/22/22 1754 01/22/22 2159 01/23/22 0518  BP: 118/74 130/73 119/79 131/75  Pulse: 93 (!) 105 95 81  Resp: $Remo'18 20 18 16  'ztFcP$ Temp: 98.8 F (37.1 C) 98.2 F (36.8 C) 97.9 F (36.6 C) 98 F (36.7 C)  TempSrc: Oral   Oral  SpO2: 98%  99% 99%  Weight:      Height:       General: alert, cooperative, and no distress Lochia: appropriate Uterine Fundus: firm Incision: N/A DVT Evaluation: No evidence of DVT seen on physical exam. Labs: Lab Results  Component Value Date   WBC 23.3 (H) 01/22/2022   HGB 13.3 01/22/2022   HCT 37.0 01/22/2022   MCV 85.5 01/22/2022   PLT 202 01/22/2022   CMP Latest Ref Rng & Units 01/20/2022  Glucose 70 - 99 mg/dL 81  BUN 6 - 20 mg/dL <5(L)  Creatinine 0.44 - 1.00 mg/dL 0.73  Sodium 135 - 145 mmol/L 137  Potassium 3.5 - 5.1 mmol/L 3.4(L)  Chloride 98 - 111 mmol/L 104  CO2 22 - 32 mmol/L 22  Calcium 8.9 - 10.3 mg/dL 8.8(L)  Total Protein 6.5 - 8.1 g/dL 6.5  Total Bilirubin 0.3 - 1.2 mg/dL 0.4  Alkaline Phos 38 - 126 U/L 133(H)  AST 15 - 41 U/L 21  ALT 0 - 44 U/L 16   Edinburgh Score: Edinburgh Postnatal Depression Scale Screening Tool 01/22/2022  I have been able to laugh and see the funny  side of things. 0  I have looked forward with enjoyment to things. 0  I have blamed myself unnecessarily when things went wrong. 1  I have been anxious or worried for no good reason. 1  I have felt scared or panicky for no good reason. 1  Things have been getting on top of me. 0  I have been so unhappy that I have had difficulty sleeping. 0  I have felt sad or miserable. 0  I have been so unhappy that I have been crying. 1  The thought of harming myself has occurred to me. 0  Edinburgh Postnatal Depression Scale Total 4     After visit meds:  Allergies as of 01/23/2022       Reactions   Peanut-containing Drug Products Anaphylaxis        Medication List     STOP taking these medications    Accu-Chek Guide w/Device Kit    Accu-Chek Softclix Lancets lancets   aspirin EC 81 MG tablet   glucose blood test strip       TAKE these medications    acetaminophen 325 MG tablet Commonly known as: Tylenol Take 2 tablets (650 mg total) by mouth every 4 (four) hours as needed (for pain scale < 4).   ibuprofen 600 MG tablet Commonly known as: ADVIL Take 1 tablet (600 mg total) by mouth every 6 (six) hours.   PRENATAL VITAMINS PO Take by mouth.         Discharge home in stable condition Infant Feeding: Breast Infant Disposition:home with mother Discharge instruction: per After Visit Summary and Postpartum booklet. Activity: Advance as tolerated. Pelvic rest for 6 weeks.  Diet: routine diet Future Appointments: Future Appointments  Date Time Provider [place]  02/25/2022 10:35 AM Truett Mainland, DO [facility] None   Follow up Visit:  [facility] [place] Follow up.   Specialty: Obstetrics and Gynecology Why: BP check in 1-2wks; PP visit in 5 weeks Contact information: [address] [place] [id number] [phone]                (Already had PP visit scheduled; msg sent for BP check in 1-2wks)   01/23/2022 Fatima Blank, CNM

## 2022-01-22 NOTE — Lactation Note (Signed)
This note was copied from a baby's chart. Lactation Consultation Note Mom having difficulty maintaining latch. Mom stated the baby will latch but not stay on.  Mom was given shells to wear today and did so. Stated they were very helpful, she can tell a lot of difference in her breast. Lt. Areola still has edema. Baby wouldn't be able to latch to Lt. Breast at this time. LC done finger massage and reverse pressure, some helpful, but tender. W/t-cup hold baby latched to Rt. Breast after several attempts and finally was able to obtain deep enough latch and maintained her self. Mom stated that was the longest she has stayed on the breast. Encouraged football hold for BF at this time. Praised mom for wearing shells and strongly encouraged mom to wear them for several days until she notices edema gone. Encouraged pre-pumping to evert nipples more before latching. Mom has pump colostrum and in a couple of bottles in refrigerator. Praised mom. Gave mom some storage bottles and reviewed milk storage and newborn feeding behavior. Encouraged to call for assistance or questions.   Patient Name: Crystal Powell [id number] Date: 01/22/2022 Reason for consult: Mother's request;Difficult latch;Primapara;Term;Maternal endocrine disorder Age:33 hours  Maternal Data Does the patient have breastfeeding experience prior to this delivery?: No  Feeding    LATCH Score Latch: Grasps breast easily, tongue down, lips flanged, rhythmical sucking.  Audible Swallowing: A few with stimulation  Type of Nipple: Flat (edema to Lt. areola/Rt. semi flat compressible)  Comfort (Breast/Nipple): Filling, red/small blisters or bruises, mild/mod discomfort (Lt. areola edema)  Hold (Positioning): Assistance needed to correctly position infant at breast and maintain latch.  LATCH Score: 6   Lactation Tools Discussed/Used Tools: Shells;Pump Breast pump type: Manual Pump Education: Milk Storage Reason for Pumping:  pre-pumping before latching  Interventions Interventions: Breast feeding basics reviewed;Assisted with latch;Skin to skin;Breast massage;Pre-pump if needed;Reverse pressure;Breast compression;Adjust position;Support pillows;Position options;Hand pump  Discharge    Consult Status Consult Status: Follow-up Date: 01/23/22 Follow-up type: In-patient    Theodoro Kalata 01/22/2022, 11:16 PM

## 2022-01-22 NOTE — Lactation Note (Signed)
This note was copied from a baby's chart. Lactation Consultation Note  Patient Name: Crystal Powell [id number] Date: 01/22/2022 Reason for consult: Initial assessment;1st time breastfeeding;Maternal endocrine disorder Age:25 hours  P1, Baby sleeping.  Mother states her L nipple is usual more evert.  Provided education regarding areola edema. Reviewed reverse pressure softening, provided hand pump and shells.   Mother has good flow of colostrum. Suggest calling for assistance with next feeding. Feed on demand with cues.  Goal 8-12+ times per day after first 24 hrs.  Place baby STS if not cueing.  Mom made aware of O/P services, breastfeeding support groups, and our phone # for post-discharge questions.    Maternal Data Has patient been taught Hand Expression?: Yes Does the patient have breastfeeding experience prior to this delivery?: No  Feeding Mother's Current Feeding Choice: Breast Milk   Lactation Tools Discussed/Used Tools: Shells;Pump Breast pump type: Manual Reason for Pumping: help evert L nipple Pumping frequency: prepump as needed  Interventions Interventions: Breast feeding basics reviewed;Hand express;Pre-pump if needed;Hand pump;Shells;Education;LC Services brochure  Discharge Pump: Personal;DEBP (Max Flow)  Consult Status Consult Status: Follow-up Date: 01/22/22 Follow-up type: In-patient    [name] [facility] 01/22/2022, 9:56 AM

## 2022-01-23 ENCOUNTER — Ambulatory Visit: Payer: Self-pay

## 2022-01-23 LAB — RH IG WORKUP (INCLUDES ABO/RH)
Fetal Screen: NEGATIVE
Gestational Age(Wks): 39.4
Unit division: 0

## 2022-01-23 MED ORDER — ACETAMINOPHEN 325 MG PO TABS: 650.0000 mg | ORAL_TABLET | ORAL | 0 refills | Status: DC | PRN

## 2022-01-23 MED ORDER — IBUPROFEN 600 MG PO TABS: 600.0000 mg | ORAL_TABLET | Freq: Four times a day (QID) | ORAL | 0 refills | Status: DC

## 2022-01-23 MED ORDER — FUROSEMIDE 20 MG PO TABS: 20.0000 mg | ORAL_TABLET | Freq: Every day | ORAL | 0 refills | Status: DC

## 2022-01-23 NOTE — Lactation Note (Signed)
This note was copied from a baby's chart. Lactation Consultation Note  Patient Name: Crystal Powell [id number] Date: 01/23/2022 Reason for consult: Follow-up assessment;Mother's request;Difficult latch;Term;Breastfeeding assistance;Maternal endocrine disorder Age:26 years  LC assisted latching infant to breast with signs of milk transfer priming 24 NS with EBM via curve tip syringe. With breast compression, signs of milk transfer noted and Mom stated uterine contractions more pronounced.   Plan 1. To feed based on cues 8-12x 24hr period. Mom to offer breasts and look for signs of milk transfer.  2. Mom to supplement with EBM with spoon 5 ml or less or pace bottle feeding with slow flow nipple if more.  3. DEBP q 3hrs for 15 min  All questions answered at the end of the visit.   Maternal Data Has patient been taught Hand Expression?: Yes  Feeding Mother's Current Feeding Choice: Breast Milk  LATCH Score Latch: Repeated attempts needed to sustain latch, nipple held in mouth throughout feeding, stimulation needed to elicit sucking reflex.  Audible Swallowing: Spontaneous and intermittent  Type of Nipple: Everted at rest and after stimulation  Comfort (Breast/Nipple): Soft / non-tender  Hold (Positioning): Assistance needed to correctly position infant at breast and maintain latch.  LATCH Score: 8   Lactation Tools Discussed/Used Tools: Pump;Flanges;Shells;Nipple Shields Nipple shield size: 24 Flange Size: 27 Breast pump type: Double-Electric Breast Pump Pump Education: Setup, frequency, and cleaning;Milk Storage Reason for Pumping: increase stimulation Pumping frequency: every 3 hrs for 15 min  Interventions Interventions: Breast feeding basics reviewed;Assisted with latch;Skin to skin;Breast massage;Hand express;Breast compression;Adjust position;Support pillows;Position options;Expressed milk;Shells;DEBP;Education;LC Services brochure;Infant Driven Feeding Algorithm  education;Pace feeding  Discharge Pump: Personal  Consult Status Consult Status: Follow-up Date: 01/24/22 Follow-up type: In-patient    Crystal Kennan  Powell 01/23/2022, 4:25 PM

## 2022-01-23 NOTE — Lactation Note (Addendum)
This note was copied from a baby's chart. Lactation Consultation Note  Patient Name: Crystal Powell [id number] Date: 01/23/2022 Reason for consult: Follow-up assessment;Mother's request;Difficult latch;Term;Maternal endocrine disorder Age:25 hours  LC assisted latching infant on the breast with help of 24 NS primed with EBM with curve tip. Infant 13 min feeding with signs of milk transfer. LC switched to left breast latched without NS for 9 min.  Parents mentioned infant appeared yellow in color. [place] left a vocera message for RN, Apolonio Schneiders B expressing their concerns. Summary of findings with feeding also relayed in message.   BF supplementation guide provided.   Plan 1. To feed based on cues 8-12x 24hr period. Mom to offer breast and look for signs of milk transfer.  2. Mom to supplement with EBM with slow flow nipple and pace bottle feeding 7-12 ml per feeding.   3. DEBP q 3hrs for 15 min   All questions answered at the end of the visit.   Maternal Data Has patient been taught Hand Expression?: Yes  Feeding Mother's Current Feeding Choice: Breast Milk  LATCH Score Latch: Repeated attempts needed to sustain latch, nipple held in mouth throughout feeding, stimulation needed to elicit sucking reflex. (Infant required use of 24 NS to latch on the Right. On the left, infant able to latch to breast direclty.)  Audible Swallowing: Spontaneous and intermittent  Type of Nipple: Everted at rest and after stimulation  Comfort (Breast/Nipple): Soft / non-tender  Hold (Positioning): Assistance needed to correctly position infant at breast and maintain latch.  LATCH Score: 8   Lactation Tools Discussed/Used Tools: Pump;Flanges;Nipple [employer];Shells Nipple shield size: 24 Flange Size: 24;27 Breast pump type: Double-Electric Breast Pump Pump Education: Setup, frequency, and cleaning;Milk Storage Reason for Pumping: increase stimulation Pumping frequency: every 3 hrs for 15  min  Interventions Interventions: Breast feeding basics reviewed;Assisted with latch;Skin to skin;Breast massage;Hand express;Breast compression;Position options;Support pillows;Adjust position;Expressed milk;Shells;DEBP;Education;Pace feeding;Infant Driven Feeding Algorithm education  Discharge Pump: DEBP  Consult Status Consult Status: Follow-up Date: 01/24/22 Follow-up type: In-patient    Crystal Powell  Crystal Powell 01/23/2022, 4:53 PM

## 2022-01-24 ENCOUNTER — Ambulatory Visit: Payer: Self-pay

## 2022-01-24 NOTE — Lactation Note (Signed)
This note was copied from a baby's chart. Lactation Consultation Note  Patient Name: Crystal Powell [id number] Date: 01/24/2022 Reason for consult: Follow-up assessment Age:25 hours  P1, Latching is still improving.  She is using #24NS on R breast but does not need NS on L breast.   Mother is post pumping 5-7 ml and giving volume back to baby.  Suggest calling for LC to view next feeding. Reviewed engorgement care and monitoring voids/stools.  Feeding Mother's Current Feeding Choice: Breast Milk   Lactation Tools Discussed/Used Tools: Shells;Pump;Flanges Nipple shield size: 24 Flange Size: 27 Breast pump type: Double-Electric Breast Pump;Manual Reason for Pumping: stimulation and supplementation Pumping frequency: q 3 hours Pumped volume: 7 mL  Interventions Interventions: Education;Pace feeding  Discharge Discharge Education: Engorgement and breast care;Warning signs for feeding baby  Consult Status Consult Status: Complete Date: 01/24/22    [name] [employer] 01/24/2022, 9:27 AM

## 2022-01-25 ENCOUNTER — Inpatient Hospital Stay (HOSPITAL_COMMUNITY): Admit: 2022-01-25 | Payer: Self-pay

## 2022-01-25 DIAGNOSIS — O209 Hemorrhage in early pregnancy, unspecified: Secondary | ICD-10-CM

## 2022-01-25 DIAGNOSIS — Z3400 Encounter for supervision of normal first pregnancy, unspecified trimester: Secondary | ICD-10-CM

## 2022-01-25 DIAGNOSIS — O24410 Gestational diabetes mellitus in pregnancy, diet controlled: Secondary | ICD-10-CM

## 2022-01-25 DIAGNOSIS — O2343 Unspecified infection of urinary tract in pregnancy, third trimester: Secondary | ICD-10-CM

## 2022-01-25 HISTORY — DX: Type 2 diabetes mellitus without complications: E11.9

## 2022-01-31 ENCOUNTER — Ambulatory Visit (INDEPENDENT_AMBULATORY_CARE_PROVIDER_SITE_OTHER): Payer: No Typology Code available for payment source

## 2022-01-31 ENCOUNTER — Encounter: Payer: Self-pay | Admitting: General Practice

## 2022-01-31 ENCOUNTER — Other Ambulatory Visit: Payer: Self-pay

## 2022-01-31 VITALS — BP 131/88 | HR 87 | Wt 162.0 lb

## 2022-01-31 DIAGNOSIS — O10919 Unspecified pre-existing hypertension complicating pregnancy, unspecified trimester: Secondary | ICD-10-CM

## 2022-01-31 NOTE — Progress Notes (Cosign Needed)
Patient is nine days postpartum presents for bp check. Patient reports that her bp was good during delivery but she did spike temperature after delivery so she said in hospital extra days for that.  ? ?Patient denies any headaches dizziness or blurry vision.  ?Patient is scheduled to return for postpartum visit on March 31,2023. ?Kathrene Alu RN  ?

## 2022-02-25 ENCOUNTER — Ambulatory Visit (INDEPENDENT_AMBULATORY_CARE_PROVIDER_SITE_OTHER): Payer: No Typology Code available for payment source | Admitting: Family Medicine

## 2022-02-25 ENCOUNTER — Encounter: Payer: Self-pay | Admitting: Family Medicine

## 2022-02-25 DIAGNOSIS — O10919 Unspecified pre-existing hypertension complicating pregnancy, unspecified trimester: Secondary | ICD-10-CM | POA: Diagnosis not present

## 2022-02-25 DIAGNOSIS — Z30017 Encounter for initial prescription of implantable subdermal contraceptive: Secondary | ICD-10-CM

## 2022-02-25 LAB — POCT URINE PREGNANCY: Preg Test, Ur: NEGATIVE

## 2022-02-25 MED ORDER — ETONOGESTREL 68 MG ~~LOC~~ IMPL
68.0000 mg | DRUG_IMPLANT | Freq: Once | SUBCUTANEOUS | Status: AC
  Administered 2022-02-25: 68 mg via SUBCUTANEOUS

## 2022-02-25 NOTE — Progress Notes (Signed)
Nexplanon Insertion:  Patient given informed consent, signed copy in the chart, time out was performed. Pregnancy test was neg. Appropriate time out taken.  Patient's left arm was prepped and draped in the usual sterile fashion.. The ruler used to measure and mark insertion area.  Pt was prepped with alcohol swab and then injected with 5 cc of 2% lidocaine with epinephrine.  Pt was prepped with betadine, Implanon removed form packaging,  Device confirmed in needle, then inserted full length of needle and withdrawn per handbook instructions.  Device palpated by physician and patient.  Pt insertion site covered with pressure dressing.   Minimal blood loss.  Pt tolerated the procedure well.  

## 2022-02-25 NOTE — Progress Notes (Signed)
? ? ?Post Partum Visit Note ? ?Crystal Powell is a 26 y.o. G30P1001 female who presents for a postpartum visit. She is 5 weeks postpartum following a normal spontaneous vaginal delivery.  I have fully reviewed the prenatal and intrapartum course. The delivery was at 39 gestational weeks.  Anesthesia: epidural. Postpartum course has been normal. Baby is doing well. Baby is feeding by breast. Bleeding no bleeding. Bowel function is normal. Bladder function is normal. Patient is not sexually active. Contraception method is Nexplanon. Postpartum depression screening: negative. ? ? ?The pregnancy intention screening data noted above was reviewed. Potential methods of contraception were discussed. The patient elected to proceed with No data recorded. ? ? Edinburgh Postnatal Depression Scale - 02/25/22 1034   ? ?  ? Edinburgh Postnatal Depression Scale:  In the Past 7 Days  ? I have been able to laugh and see the funny side of things. 0   ? I have looked forward with enjoyment to things. 0   ? I have blamed myself unnecessarily when things went wrong. 0   ? I have been anxious or worried for no good reason. 1   ? I have felt scared or panicky for no good reason. 0   ? Things have been getting on top of me. 0   ? I have been so unhappy that I have had difficulty sleeping. 0   ? I have felt sad or miserable. 1   ? I have been so unhappy that I have been crying. 0   ? The thought of harming myself has occurred to me. 0   ? Edinburgh Postnatal Depression Scale Total 2   ? ?  ?  ? ?  ? ? ?Health Maintenance Due  ?Topic Date Due  ? COVID-19 Vaccine (1) Never done  ? URINE MICROALBUMIN  Never done  ? HPV VACCINES (1 - 2-dose series) Never done  ? INFLUENZA VACCINE  Never done  ? ? ?The following portions of the patient's history were reviewed and updated as appropriate: allergies, current medications, past family history, past medical history, past social history, past surgical history, and problem list. ? ?Review of  Systems ?Pertinent items are noted in HPI. ? ?Objective:  ?BP 121/83   Pulse 80   Wt 158 lb (71.7 kg)   LMP 04/10/2021   Breastfeeding Yes   BMI 30.86 kg/m?   ? ?General:  alert, cooperative, and no distress  ? Breasts:  not indicated  ?Lungs: clear to auscultation bilaterally  ?Heart:  regular rate and rhythm, S1, S2 normal, no murmur, click, rub or gallop  ?Abdomen: soft, non-tender; bowel sounds normal; no masses,  no organomegaly   ?Wound N/a  ?GU exam:  not indicated  ?     ?Assessment:  ? ?  ?1. Postpartum exam   ?2. Encounter for initial prescription of Nexplanon   ?3. Chronic hypertension affecting pregnancy   ? ? ? ?Plan:  ? ?Essential components of care per ACOG recommendations: ? ?1.  Mood and well being: Patient with negative depression screening today. Reviewed local resources for support.  ?- Patient tobacco use? No.   ?- hx of drug use? No.   ? ?2. Infant care and feeding:  ?-Patient currently breastmilk feeding? Yes. Reviewed importance of draining breast regularly to support lactation.  ?-Social determinants of health (SDOH) reviewed in EPIC. No concerns ? ?3. Sexuality, contraception and birth spacing ?- Patient does not want a pregnancy in the next year.  ?- Reviewed reproductive  life planning. Reviewed contraceptive methods based on pt preferences and effectiveness.  Patient desired Hormonal Implant today.   ?- Discussed birth spacing of 18 months ? ?4. Sleep and fatigue ?-Encouraged family/partner/community support of 4 hrs of uninterrupted sleep to help with mood and fatigue ? ?5. Physical Recovery  ?- Discussed patients delivery and complications. She describes her labor as good. ?- Patient had a Vaginal, no problems at delivery. Patient had a 1st degree laceration. Perineal healing reviewed. Patient expressed understanding ?- Patient has urinary incontinence? No. ?- Patient is safe to resume physical and sexual activity ? ?6.  Health Maintenance ?- HM due items addressed Yes ?- Last pap  smear  ?Diagnosis  ?Date Value Ref Range Status  ?12/30/2020   Final  ? - Negative for intraepithelial lesion or malignancy (NILM)  ? Pap smear not done at today's visit.  ?-Breast Cancer screening indicated? No.  ? ?7. Chronic Disease/Pregnancy Condition follow up: Hypertension ? ?- PCP follow up ? ?Levie Heritage, DO ?Center for [employer], [facility]  ?

## 2022-02-28 ENCOUNTER — Encounter: Payer: Self-pay | Admitting: General Practice

## 2022-05-05 DIAGNOSIS — Z23 Encounter for immunization: Secondary | ICD-10-CM | POA: Diagnosis not present

## 2022-07-28 IMAGING — US US MFM OB FOLLOW-UP
1 series · 13 of 28 positions shown · non-contrast
Comparison: none

[Series 1: us mfm ob follow-up · 94 acquisitions, 13 frames shown]
[im 4/94]
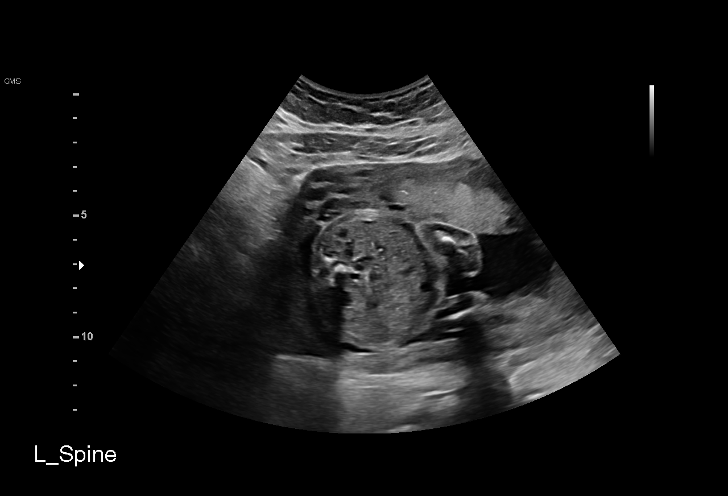
[im 11/94]
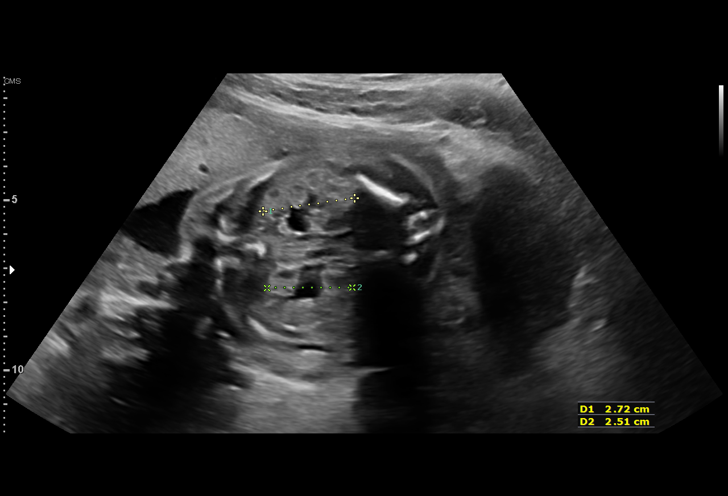
[im 18/94]
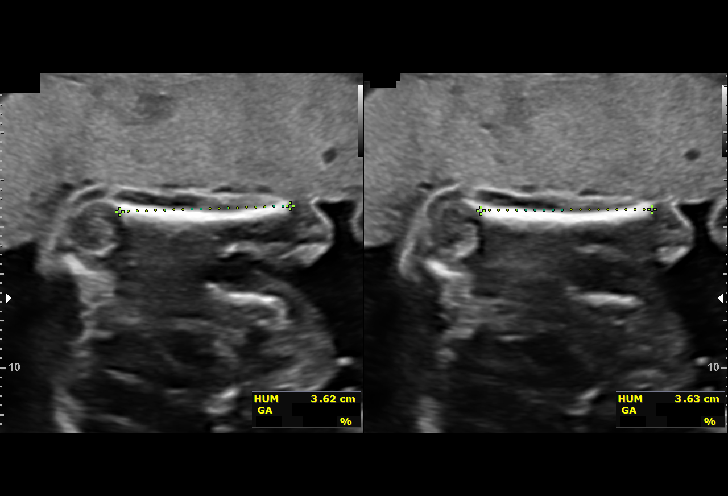
[im 25/94]
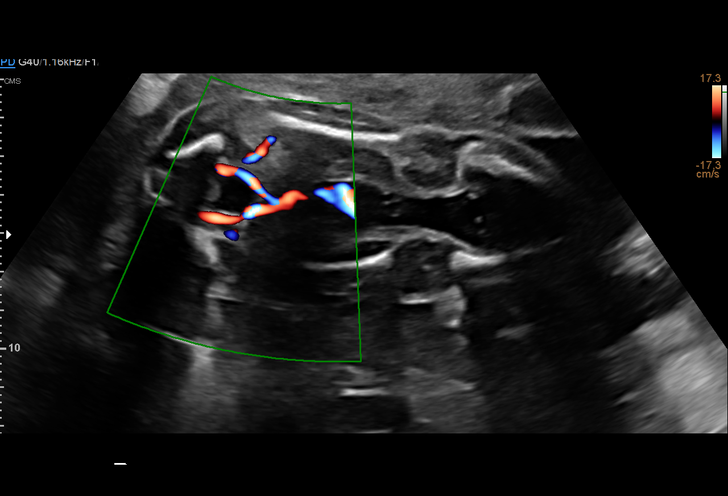
[im 32/94]
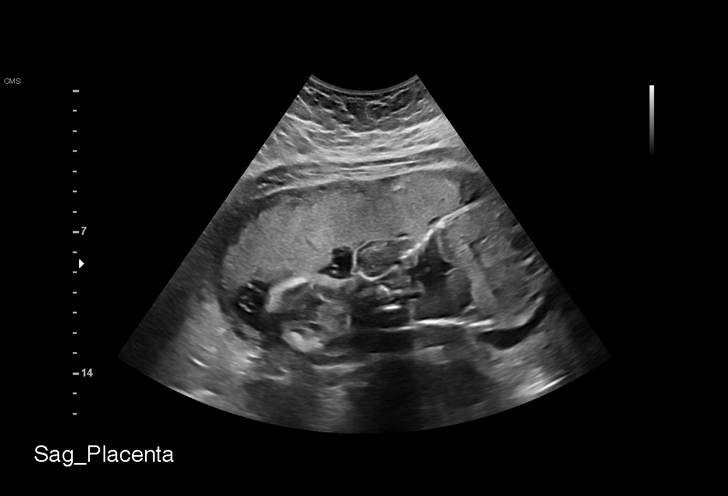
[im 38/94]
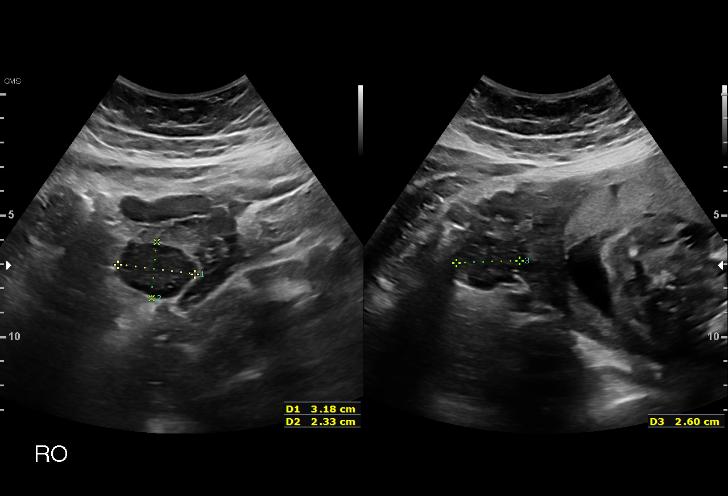
[im 49/94]
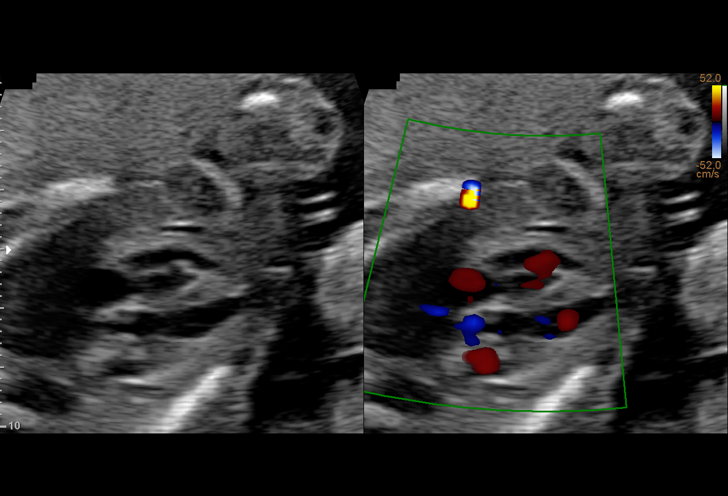
[im 56/94]
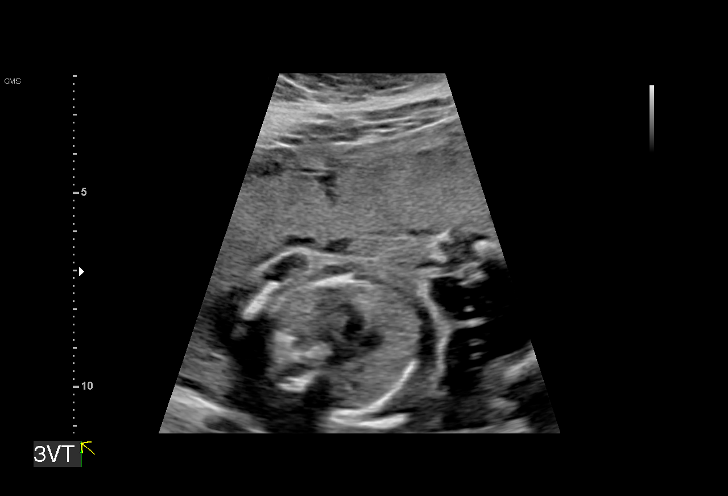
[im 63/94]
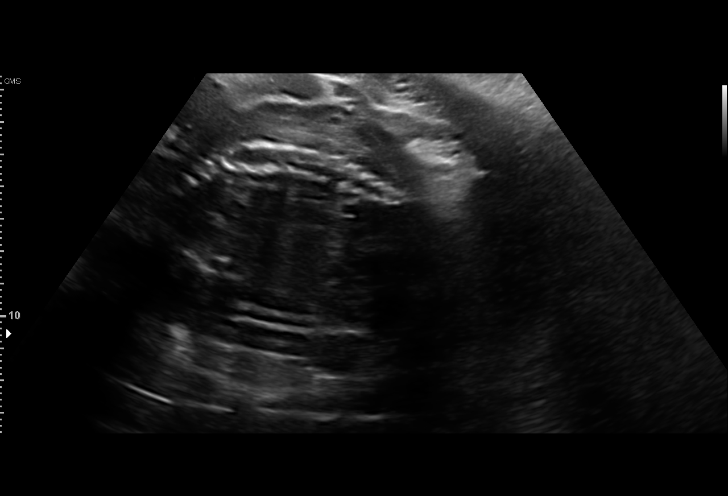
[im 69/94]
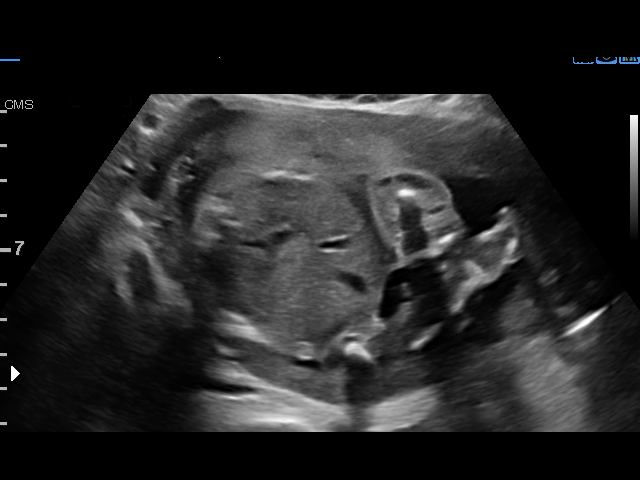
[im 76/94]
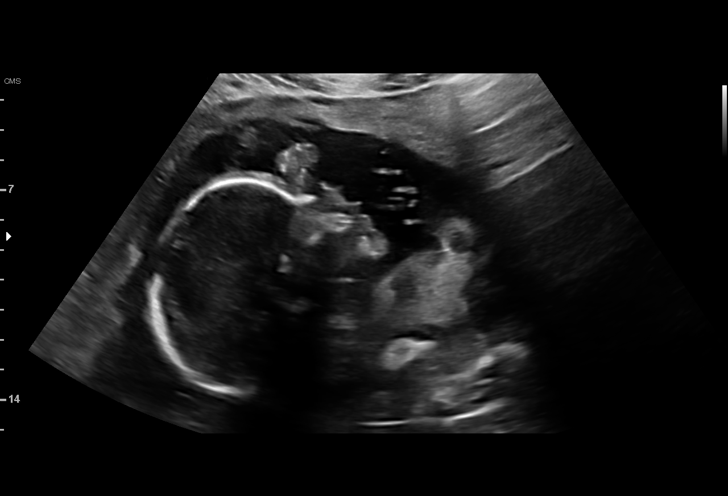
[im 83/94]
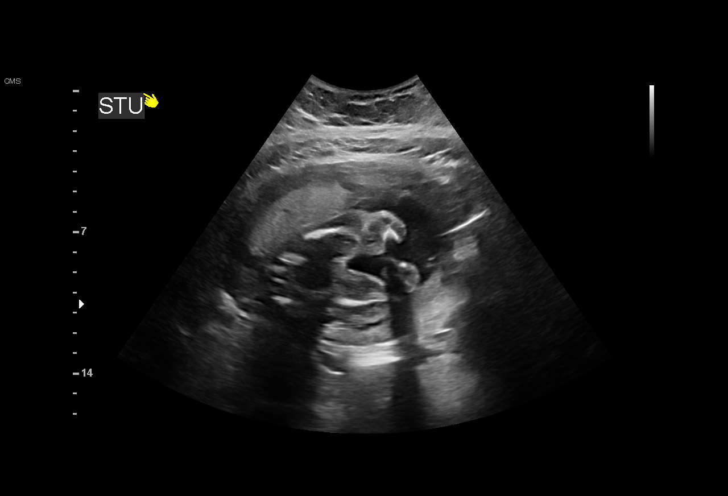
[im 90/94]
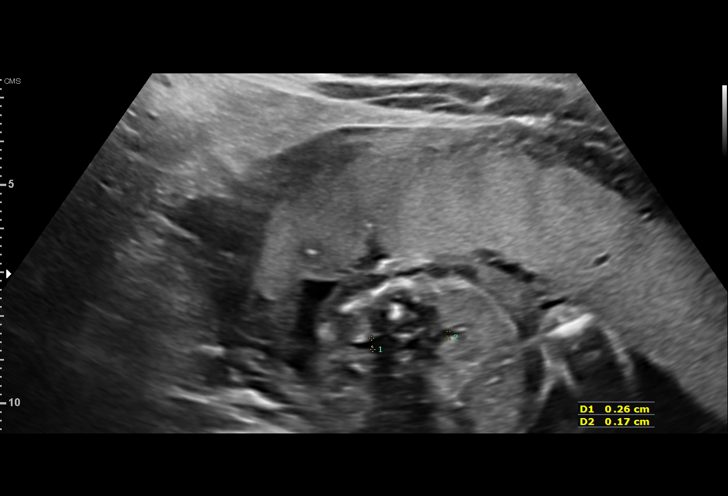

[13 of 28 positions shown; findings below may reference images not displayed]

Indications

 22 weeks gestation of pregnancy
 Obesity complicating pregnancy, second
 trimester (pregravid BMI 30.86)
 Antenatal follow-up for nonvisualized fetal
 anatomy
 Low risk NIPS, neg AFP/Horizon 4
Fetal Evaluation

 Num Of Fetuses:         1
 Fetal Heart Rate(bpm):  145
 Cardiac Activity:       Observed
 Presentation:           Breech
 Placenta:               Anterior
 P. Cord Insertion:      Previously Visualized

 Amniotic Fluid
 AFI FV:      Within normal limits

                             Largest Pocket(cm)

Biometry

 BPD:      51.4  mm     G. Age:  21w 4d          8  %    CI:        63.54   %    70 - 86
                                                         FL/HC:      18.1   %    19.2 -
 HC:      207.7  mm     G. Age:  22w 6d         36  %    HC/AC:      1.11        1.05 -
 AC:      187.5  mm     G. Age:  23w 4d         64  %    FL/BPD:     73.2   %    71 - 87
 FL:       37.6  mm     G. Age:  22w 0d         15  %    FL/AC:      20.1   %    20 - 24
 HUM:      36.3  mm     G. Age:  22w 5d         38  %
 LV:          5  mm
 Est. FW:     535  gm      1 lb 3 oz     39  %
OB History

 Gravidity:    1         Term:   0        Prem:   0        SAB:   0
 TOP:          0       Ectopic:  0        Living: 0
Gestational Age

 LMP:           24w 2d        Date:  04/10/21                 EDD:   01/15/22
 U/S Today:     22w 4d                                        EDD:   01/27/22
 Best:          22w 6d     Det. By:  Early Ultrasound         EDD:   01/25/22
                                     (06/01/21)
Anatomy

 Cranium:               Appears normal         LVOT:                   Previously seen
 Cavum:                 Appears normal         Aortic Arch:            Appears normal
 Ventricles:            Appears normal         Ductal Arch:            Previously seen
 Choroid Plexus:        Appears normal         Diaphragm:              Appears normal
 Cerebellum:            Appears normal         Stomach:                Appears normal, left
                                                                       sided
 Posterior Fossa:       Appears normal         Abdomen:                Appears normal
 Nuchal Fold:           Previously seen        Abdominal Wall:         Appears nml (cord
                                                                       insert, abd wall)
 Face:                  Appears normal         Cord Vessels:           Appears normal (3
                        (orbits and profile)                           vessel cord)
 Lips:                  Appears normal         Kidneys:                Appear normal
 Palate:                Not well visualized    Bladder:                Appears normal
 Thoracic:              Appears normal         Spine:                  Appears normal
 Heart:                 Appears normal         Upper Extremities:      Previously seen
                        (4CH, axis, and
                        situs)
 RVOT:                  Appears normal         Lower Extremities:      Previously seen

 Other:  Technically difficult due to fetal position.
Targeted Anatomy

 Extremities
 Lt Foot:               Appears normal         Rt Foot:                Appears normal
Cervix Uterus Adnexa

 Cervix
 Length:           3.76  cm.
 Normal appearance by transabdominal scan.

 Uterus
 No abnormality visualized.

 Right Ovary
 Within normal limits.

 Left Ovary
 Within normal limits.
 Cul De Sac
 No free fluid seen.

 Adnexa
 No abnormality visualized.
Impression

 Patient returned for completion of fetal anatomy .Amniotic
 fluid is normal and good fetal activity is seen .Fetal biometry
 is consistent with her previously-established dates .Fetal
 anatomical survey was completed and appears normal.

 Blood pressure today at her office is 143/80 mmHg.  Patient
 does not have signs and symptoms of severe features of
 preeclampsia.  She is being checking blood pressures at
 home and they have been within normal range.
 I briefly counseled the patient that gestational
 hypertension/preeclampsia is more common in first
 pregnancies.
Recommendations

 -An appointment was made for her to return in 5 weeks for
 fetal growth assessment.
                 Boro, Hon Kwong

## 2022-08-31 DIAGNOSIS — E669 Obesity, unspecified: Secondary | ICD-10-CM | POA: Diagnosis not present

## 2022-08-31 DIAGNOSIS — Z87892 Personal history of anaphylaxis: Secondary | ICD-10-CM | POA: Diagnosis not present

## 2022-08-31 DIAGNOSIS — Z6830 Body mass index (BMI) 30.0-30.9, adult: Secondary | ICD-10-CM | POA: Diagnosis not present

## 2022-08-31 DIAGNOSIS — Z91010 Allergy to peanuts: Secondary | ICD-10-CM | POA: Diagnosis not present

## 2022-08-31 DIAGNOSIS — R030 Elevated blood-pressure reading, without diagnosis of hypertension: Secondary | ICD-10-CM | POA: Diagnosis not present

## 2022-08-31 DIAGNOSIS — Z8249 Family history of ischemic heart disease and other diseases of the circulatory system: Secondary | ICD-10-CM | POA: Diagnosis not present

## 2023-01-06 DIAGNOSIS — J029 Acute pharyngitis, unspecified: Secondary | ICD-10-CM | POA: Diagnosis not present

## 2023-01-06 DIAGNOSIS — R0989 Other specified symptoms and signs involving the circulatory and respiratory systems: Secondary | ICD-10-CM | POA: Diagnosis not present

## 2023-01-06 DIAGNOSIS — R0981 Nasal congestion: Secondary | ICD-10-CM | POA: Diagnosis not present

## 2023-01-06 DIAGNOSIS — R509 Fever, unspecified: Secondary | ICD-10-CM | POA: Diagnosis not present

## 2023-01-06 DIAGNOSIS — R051 Acute cough: Secondary | ICD-10-CM | POA: Diagnosis not present

## 2023-02-09 ENCOUNTER — Ambulatory Visit (INDEPENDENT_AMBULATORY_CARE_PROVIDER_SITE_OTHER): Payer: 59 | Admitting: Family Medicine

## 2023-02-09 ENCOUNTER — Encounter: Payer: Self-pay | Admitting: Family Medicine

## 2023-02-09 VITALS — BP 123/76 | HR 110 | Ht 61.0 in | Wt 171.0 lb

## 2023-02-09 DIAGNOSIS — Z01419 Encounter for gynecological examination (general) (routine) without abnormal findings: Secondary | ICD-10-CM | POA: Diagnosis not present

## 2023-02-09 DIAGNOSIS — N921 Excessive and frequent menstruation with irregular cycle: Secondary | ICD-10-CM | POA: Diagnosis not present

## 2023-02-09 DIAGNOSIS — Z3046 Encounter for surveillance of implantable subdermal contraceptive: Secondary | ICD-10-CM | POA: Diagnosis not present

## 2023-02-09 MED ORDER — NORETHINDRONE ACETATE 5 MG PO TABS: 5.0000 mg | ORAL_TABLET | Freq: Every day | ORAL | 2 refills | Status: DC

## 2023-02-09 NOTE — Progress Notes (Signed)
Patient complaining of irregular bleeding on Nexplanon.  Last pap 12/2020.Kathrene Alu RN

## 2023-02-09 NOTE — Progress Notes (Signed)
ANNUAL EXAM Patient name: Crystal Powell MRN [id number]  Date of birth: 19-Dec-1995 Chief Complaint:   Annual Exam  History of Present Illness:   Crystal Powell is a 27 y.o.  G33P1001  female  being seen today for a routine annual exam.  Current complaints: irregular bleeding with nexplanon. In last month, had about 8 days or so of not bleeding. Some bleeding is really light, other is heavy. Planning on having nexplanon out and trying for pregnancy in summer.  Patient's last menstrual period was 02/06/2023.    Last pap 2022. Results were:  normal . H/O abnormal pap: no Last mammogram: n/a     11/04/2021    3:09 PM 10/19/2021    8:50 AM 07/01/2021    9:08 AM  Depression screen PHQ 2/9  Decreased Interest 0 0 0  Down, Depressed, Hopeless 0 0 0  PHQ - 2 Score 0 0 0  Altered sleeping  0 0  Tired, decreased energy  0 1  Change in appetite  0 0  Feeling bad or failure about yourself   0 0  Trouble concentrating  0 0  Moving slowly or fidgety/restless  0 0  Suicidal thoughts  0 0  PHQ-9 Score  0 1        10/19/2021    8:50 AM 07/01/2021    9:08 AM  GAD 7 : Generalized Anxiety Score  Nervous, Anxious, on Edge 0 0  Control/stop worrying 0 0  Worry too much - different things 0 0  Trouble relaxing 0 0  Restless 0 0  Easily annoyed or irritable 0 0  Afraid - awful might happen 0 0  Total GAD 7 Score 0 0     Review of Systems:   Pertinent items are noted in HPI Denies any headaches, blurred vision, fatigue, shortness of breath, chest pain, abdominal pain, abnormal vaginal discharge/itching/odor/irritation, problems with periods, bowel movements, urination, or intercourse unless otherwise stated above. Pertinent History Reviewed:  Reviewed past medical,surgical, social and family history.  Reviewed problem list, medications and allergies. Physical Assessment:   Vitals:   02/09/23 1351  BP: 123/76  Pulse: (!) 110  Weight: 171 lb (77.6 kg)  Height: '5\' 1"'$  (1.549 m)   Body mass index is 32.31 kg/m.        Physical Examination:   General appearance - well appearing, and in no distress  Mental status - alert, oriented to person, place, and time  Psych:  She has a normal mood and affect  Skin - warm and dry, normal color, no suspicious lesions noted  Chest - effort normal, all lung fields clear to auscultation bilaterally  Heart - normal rate and regular rhythm  Neck:  midline trachea, no thyromegaly or nodules  Breasts - defered  Abdomen - soft, nontender, nondistended, no masses or organomegaly  Pelvic - VULVA: normal appearing vulva with no masses, tenderness or lesions  VAGINA: normal appearing vagina with normal color and discharge, no lesions  CERVIX: normal appearing cervix without discharge or lesions, no CMT  Thin prep pap is not done   UTERUS: uterus is felt to be normal size, shape, consistency and nontender   ADNEXA: No adnexal masses or tenderness noted.  Extremities:  No swelling or varicosities noted  Chaperone present for exam  Assessment & Plan:  1. Well woman exam with routine gynecological exam  2. Breakthrough bleeding on Nexplanon Discussed options - will give additional progesterone. If not improved in 10 days, then patient will let  me know.   No orders of the defined types were placed in this encounter.   Meds: No orders of the defined types were placed in this encounter.   Follow-up: No follow-ups on file.  Truett Mainland, DO 02/09/2023 2:32 PM

## 2023-02-17 DIAGNOSIS — N912 Amenorrhea, unspecified: Secondary | ICD-10-CM | POA: Diagnosis not present

## 2023-02-17 DIAGNOSIS — Z8249 Family history of ischemic heart disease and other diseases of the circulatory system: Secondary | ICD-10-CM | POA: Diagnosis not present

## 2023-02-20 ENCOUNTER — Encounter: Payer: Self-pay | Admitting: Family Medicine

## 2023-05-02 ENCOUNTER — Other Ambulatory Visit: Payer: Self-pay | Admitting: Family Medicine

## 2023-07-27 ENCOUNTER — Ambulatory Visit: Payer: 59 | Admitting: Family Medicine

## 2023-08-11 ENCOUNTER — Encounter: Payer: Self-pay | Admitting: Obstetrics & Gynecology

## 2023-08-11 ENCOUNTER — Ambulatory Visit: Payer: 59 | Admitting: Obstetrics & Gynecology

## 2023-08-11 VITALS — BP 135/87 | HR 94 | Wt 171.0 lb

## 2023-08-11 DIAGNOSIS — Z3046 Encounter for surveillance of implantable subdermal contraceptive: Secondary | ICD-10-CM

## 2023-08-11 NOTE — Progress Notes (Signed)
     GYNECOLOGY OFFICE PROCEDURE NOTE  [name] is a 27 y.o. G1P1001 here for Nexplanon removal.  Last pap smear was on 12/30/2020 and was normal.  No other gynecologic concerns.   Nexplanon Removal Patient identified, informed consent performed, consent signed.   Appropriate time out taken. Nexplanon site identified.  Area prepped in usual sterile fashon. One ml of 1% lidocaine was used to anesthetize the area at the distal end of the implant. A small stab incision was made right beside the implant on the distal portion.  The Nexplanon rod was grasped using hemostats and removed without difficulty.  There was minimal blood loss. There were no complications.   Steri-strips were applied over the small incision.  A pressure bandage was applied to reduce any bruising.  The patient tolerated the procedure well and was given post procedure instructions.  Patient is planning to attempt conception. Will continue taking prenatal vitamins, avoid teratogens. Optimization of other health issues recommended.  She will let us know once she conceives, or if she has any issues.   Jaynie Collins, MD, FACOG Obstetrician & Gynecologist, [facility] for [employer], [facility]

## 2023-08-11 NOTE — Patient Instructions (Signed)
Nexplanon Instructions After Removal  Keep bandage clean and dry for 24 hours  May use ice/Tylenol/Ibuprofen for soreness or pain  If you develop fever, drainage or increased warmth from incision site-contact office immediately

## 2023-10-19 DIAGNOSIS — M545 Low back pain, unspecified: Secondary | ICD-10-CM | POA: Diagnosis not present

## 2023-11-02 DIAGNOSIS — S322XXA Fracture of coccyx, initial encounter for closed fracture: Secondary | ICD-10-CM | POA: Diagnosis not present

## 2023-11-08 DIAGNOSIS — H9202 Otalgia, left ear: Secondary | ICD-10-CM | POA: Diagnosis not present

## 2023-11-08 DIAGNOSIS — J018 Other acute sinusitis: Secondary | ICD-10-CM | POA: Diagnosis not present

## 2023-11-08 DIAGNOSIS — H10022 Other mucopurulent conjunctivitis, left eye: Secondary | ICD-10-CM | POA: Diagnosis not present

## 2023-11-08 DIAGNOSIS — R0981 Nasal congestion: Secondary | ICD-10-CM | POA: Diagnosis not present

## 2023-11-08 DIAGNOSIS — R6883 Chills (without fever): Secondary | ICD-10-CM | POA: Diagnosis not present

## 2023-11-08 DIAGNOSIS — R051 Acute cough: Secondary | ICD-10-CM | POA: Diagnosis not present

## 2023-11-08 DIAGNOSIS — J038 Acute tonsillitis due to other specified organisms: Secondary | ICD-10-CM | POA: Diagnosis not present

## 2023-11-08 DIAGNOSIS — R070 Pain in throat: Secondary | ICD-10-CM | POA: Diagnosis not present

## 2023-11-08 DIAGNOSIS — H65192 Other acute nonsuppurative otitis media, left ear: Secondary | ICD-10-CM | POA: Diagnosis not present

## 2023-11-08 DIAGNOSIS — R0982 Postnasal drip: Secondary | ICD-10-CM | POA: Diagnosis not present

## 2024-01-28 ENCOUNTER — Encounter: Payer: Self-pay | Admitting: Family Medicine

## 2024-01-31 ENCOUNTER — Ambulatory Visit: Admitting: Family Medicine

## 2024-01-31 VITALS — BP 122/70 | HR 87 | Ht 61.0 in | Wt 173.0 lb

## 2024-01-31 DIAGNOSIS — N914 Secondary oligomenorrhea: Secondary | ICD-10-CM

## 2024-01-31 DIAGNOSIS — Z3169 Encounter for other general counseling and advice on procreation: Secondary | ICD-10-CM | POA: Diagnosis not present

## 2024-01-31 NOTE — Progress Notes (Signed)
   Subjective:    Patient ID: Crystal Powell, female    DOB: 1996/02/21, 28 y.o.   MRN: [id number]  HPI Patient seen for infertility. She has been trying to get pregnant for the last 6 months. She has been using an ovulation kit and times sex with her ovulation. She has been having irregular cycles - a little longer than normal: between 35-40 days. She did have difficulty getting pregnant with her first. Her husband had to take supplements in order to boost his sperm count. He never got it repeated and is still taking the vitamins.    Review of Systems     Objective:   Physical Exam Vitals reviewed.  Constitutional:      Appearance: Normal appearance.  Skin:    General: Skin is warm.     Capillary Refill: Capillary refill takes less than 2 seconds.  Neurological:     General: No focal deficit present.     Mental Status: She is alert.  Psychiatric:        Mood and Affect: Mood normal.        Behavior: Behavior normal.        Thought Content: Thought content normal.        Judgment: Judgment normal.        Assessment & Plan:  1. Secondary oligomenorrhea (Primary) Will check labs - TestT+TestF+SHBG - DHEA-sulfate - FSH - TSH Rfx on Abnormal to Free T4 - Hemoglobin A1c  2. Infertility counseling Will have get retested. Will refer to [facility].

## 2024-02-05 LAB — FOLLICLE STIMULATING HORMONE: FSH: 4 m[IU]/mL

## 2024-02-05 LAB — TESTT+TESTF+SHBG
Sex Hormone Binding: 36.3 nmol/L (ref 24.6–122.0)
Testosterone, Free: 2.7 pg/mL (ref 0.0–4.2)
Testosterone, Total, LC/MS: 37.1 ng/dL (ref 10.0–55.0)

## 2024-02-05 LAB — DHEA-SULFATE: DHEA-SO4: 179 ug/dL (ref 84.8–378.0)

## 2024-02-05 LAB — HEMOGLOBIN A1C
Est. average glucose Bld gHb Est-mCnc: 105 mg/dL
Hgb A1c MFr Bld: 5.3 % (ref 4.8–5.6)

## 2024-02-05 LAB — TSH RFX ON ABNORMAL TO FREE T4: TSH: 1.77 u[IU]/mL (ref 0.450–4.500)

## 2024-02-07 ENCOUNTER — Encounter: Payer: Self-pay | Admitting: Family Medicine

## 2024-02-08 ENCOUNTER — Encounter: Payer: Self-pay | Admitting: Family Medicine

## 2024-02-09 ENCOUNTER — Encounter: Payer: Self-pay | Admitting: Family Medicine

## 2024-05-16 ENCOUNTER — Ambulatory Visit

## 2024-05-16 ENCOUNTER — Other Ambulatory Visit: Payer: Self-pay

## 2024-05-16 DIAGNOSIS — O0990 Supervision of high risk pregnancy, unspecified, unspecified trimester: Secondary | ICD-10-CM | POA: Insufficient documentation

## 2024-05-16 DIAGNOSIS — Z3A01 Less than 8 weeks gestation of pregnancy: Secondary | ICD-10-CM | POA: Diagnosis not present

## 2024-05-16 DIAGNOSIS — O0991 Supervision of high risk pregnancy, unspecified, first trimester: Secondary | ICD-10-CM

## 2024-05-16 LAB — POCT URINE PREGNANCY: Preg Test, Ur: POSITIVE — AB

## 2024-05-16 NOTE — Progress Notes (Signed)
 New OB Intake  I explained I am completing New OB Intake today. We discussed EDD of 01/07/2025, by Last Menstrual Period. Pt is G2P1001. I reviewed her allergies, medications and Medical/Surgical/OB history.    Patient Active Problem List   Diagnosis Date Noted   Supervision of high risk pregnancy, antepartum 05/16/2024    Concerns addressed today  Patient informed that the ultrasound is considered a limited obstetric ultrasound and is not intended to be a complete ultrasound exam.  Patient also informed that the ultrasound is not being completed with the intent of assessing for fetal or placental anomalies or any pelvic abnormalities. Explained that the purpose of today's ultrasound is to assess for viability.  Patient acknowledges the purpose of the exam and the limitations of the study.     Delivery Plans Plans to deliver at [facility]. Discussed the nature of our practice with multiple providers including residents and students. Due to the size of the practice, the delivering provider may not be the same as those providing prenatal care.   MyChart/Babyscripts MyChart access verified. I explained pt will have some visits in office and some virtually. Babyscripts app discussed and ordered.   Blood Pressure Cuff Blood pressure cuff providedDiscussed to be used for virtual visits and or if needed BP checks weekly.  Anatomy US  Explained first scheduled US  will be around 19 weeks.   Last Pap Diagnosis  Date Value Ref Range Status  12/30/2020   Final   - Negative for intraepithelial lesion or malignancy (NILM)    First visit review I reviewed new OB appt with patient. Explained pt will be seen by Dr. Cathyann Cobia at first visit. Discussed [facility] [place] genetic screening with patient. Routine prenatal labs ordered.    Arrie Lares, [place] 05/16/2024  11:46 AM

## 2024-05-17 LAB — CBC/D/PLT+RPR+RH+ABO+RUBIGG...
Antibody Screen: NEGATIVE
Basophils Absolute: 0 10*3/uL (ref 0.0–0.2)
Basos: 0 %
EOS (ABSOLUTE): 0.1 10*3/uL (ref 0.0–0.4)
Eos: 1 %
HCV Ab: NONREACTIVE
HIV Screen 4th Generation wRfx: NONREACTIVE
Hematocrit: 44.3 % (ref 34.0–46.6)
Hemoglobin: 14.8 g/dL (ref 11.1–15.9)
Hepatitis B Surface Ag: NEGATIVE
Immature Grans (Abs): 0 10*3/uL (ref 0.0–0.1)
Immature Granulocytes: 0 %
Lymphocytes Absolute: 2.8 10*3/uL (ref 0.7–3.1)
Lymphs: 31 %
MCH: 30.3 pg (ref 26.6–33.0)
MCHC: 33.4 g/dL (ref 31.5–35.7)
MCV: 91 fL (ref 79–97)
Monocytes Absolute: 0.4 10*3/uL (ref 0.1–0.9)
Monocytes: 5 %
Neutrophils Absolute: 5.5 10*3/uL (ref 1.4–7.0)
Neutrophils: 63 %
Platelets: 347 10*3/uL (ref 150–450)
RBC: 4.88 x10E6/uL (ref 3.77–5.28)
RDW: 12.9 % (ref 11.7–15.4)
RPR Ser Ql: NONREACTIVE
Rh Factor: NEGATIVE
Rubella Antibodies, IGG: 5.95 {index} (ref >0.99)
WBC: 8.9 10*3/uL (ref 3.4–10.8)

## 2024-05-17 LAB — TSH RFX ON ABNORMAL TO FREE T4: TSH: 2.38 u[IU]/mL (ref 0.450–4.500)

## 2024-05-17 LAB — TSH

## 2024-05-17 LAB — HCV INTERPRETATION

## 2024-05-18 LAB — URINE CULTURE, OB REFLEX

## 2024-05-18 LAB — CULTURE, OB URINE

## 2024-05-28 ENCOUNTER — Ambulatory Visit (INDEPENDENT_AMBULATORY_CARE_PROVIDER_SITE_OTHER)

## 2024-05-28 ENCOUNTER — Other Ambulatory Visit: Payer: Self-pay

## 2024-05-28 DIAGNOSIS — O0991 Supervision of high risk pregnancy, unspecified, first trimester: Secondary | ICD-10-CM

## 2024-05-28 DIAGNOSIS — Z3A01 Less than 8 weeks gestation of pregnancy: Secondary | ICD-10-CM

## 2024-05-28 DIAGNOSIS — O3680X0 Pregnancy with inconclusive fetal viability, not applicable or unspecified: Secondary | ICD-10-CM | POA: Diagnosis not present

## 2024-05-28 DIAGNOSIS — Z3687 Encounter for antenatal screening for uncertain dates: Secondary | ICD-10-CM | POA: Diagnosis not present

## 2024-06-05 ENCOUNTER — Encounter: Payer: Self-pay | Admitting: Family Medicine

## 2024-06-07 ENCOUNTER — Other Ambulatory Visit: Payer: Self-pay

## 2024-06-07 ENCOUNTER — Inpatient Hospital Stay (HOSPITAL_COMMUNITY)

## 2024-06-07 ENCOUNTER — Telehealth: Payer: Self-pay

## 2024-06-07 ENCOUNTER — Encounter (HOSPITAL_COMMUNITY): Payer: Self-pay | Admitting: Obstetrics and Gynecology

## 2024-06-07 ENCOUNTER — Inpatient Hospital Stay (HOSPITAL_COMMUNITY)
Admission: AD | Admit: 2024-06-07 | Discharge: 2024-06-08 | Disposition: A | Discharge status: Home / Self Care | Attending: Obstetrics and Gynecology | Admitting: Obstetrics and Gynecology

## 2024-06-07 DIAGNOSIS — O034 Incomplete spontaneous abortion without complication: Secondary | ICD-10-CM | POA: Insufficient documentation

## 2024-06-07 DIAGNOSIS — Z3A01 Less than 8 weeks gestation of pregnancy: Secondary | ICD-10-CM

## 2024-06-07 DIAGNOSIS — O26891 Other specified pregnancy related conditions, first trimester: Secondary | ICD-10-CM | POA: Diagnosis not present

## 2024-06-07 DIAGNOSIS — O209 Hemorrhage in early pregnancy, unspecified: Secondary | ICD-10-CM | POA: Diagnosis present

## 2024-06-07 DIAGNOSIS — N83201 Unspecified ovarian cyst, right side: Secondary | ICD-10-CM | POA: Insufficient documentation

## 2024-06-07 DIAGNOSIS — Z6791 Unspecified blood type, Rh negative: Secondary | ICD-10-CM | POA: Diagnosis not present

## 2024-06-07 LAB — URINALYSIS, ROUTINE W REFLEX MICROSCOPIC
Bilirubin Urine: NEGATIVE
Glucose, UA: NEGATIVE mg/dL
Ketones, ur: NEGATIVE mg/dL
Leukocytes,Ua: NEGATIVE
Nitrite: NEGATIVE
Protein, ur: NEGATIVE mg/dL
Specific Gravity, Urine: 1.012 (ref 1.005–1.030)
pH: 6 (ref 5.0–8.0)

## 2024-06-07 LAB — WET PREP, GENITAL
Clue Cells Wet Prep HPF POC: NONE SEEN
Sperm: NONE SEEN
Trich, Wet Prep: NONE SEEN
WBC, Wet Prep HPF POC: >=10 — AB (ref <10)
Yeast Wet Prep HPF POC: NONE SEEN

## 2024-06-07 NOTE — Telephone Encounter (Signed)
 Patient states she notice some brown spotting when she wipes after using the bathroom. She states the blood is now bright red. She denies pain and states the spotting started on Wednesday. Advised patient to go to [facility] if she starts bleeding heavy like a period. Understanding was voiced. Luisalberto Beegle l Alfreddie Consalvo, CMA

## 2024-06-07 NOTE — MAU Note (Signed)
 MAU Triage Note:  .Crystal Powell is a 28 y.o. at [redacted]w[redacted]d here in MAU reporting: started having brown spotting on Wednesday morning and OB told her to keep an eye on her VB. Yesterday she noticed a slight increase in her bleeding - reports light, bright red vaginal bleeding with wiping. On-going abdominal cramping and diarrhea - but she believes it is from being nervous.  Patient complaint: Bleeding, 8wks   Denies pain at this time     Onset of complaint: on-going LMP: Patient's last menstrual period was 04/02/2024.  Vitals:   06/07/24 1949  BP: (!) 156/75  Pulse: (!) 101  Resp: 16  Temp: 98.8 F (37.1 C)  SpO2: 100%     Lab orders placed from triage: UA, wet prep, G/C

## 2024-06-08 ENCOUNTER — Encounter: Payer: Self-pay | Admitting: Advanced Practice Midwife

## 2024-06-08 DIAGNOSIS — O034 Incomplete spontaneous abortion without complication: Secondary | ICD-10-CM

## 2024-06-08 DIAGNOSIS — Z6791 Unspecified blood type, Rh negative: Secondary | ICD-10-CM | POA: Diagnosis not present

## 2024-06-08 DIAGNOSIS — Z3A01 Less than 8 weeks gestation of pregnancy: Secondary | ICD-10-CM

## 2024-06-08 DIAGNOSIS — O26891 Other specified pregnancy related conditions, first trimester: Secondary | ICD-10-CM

## 2024-06-08 MED ORDER — PROMETHAZINE HCL 12.5 MG PO TABS: 12.5000 mg | ORAL_TABLET | Freq: Four times a day (QID) | ORAL | 0 refills | Status: DC | PRN

## 2024-06-08 MED ORDER — TRAMADOL HCL 50 MG PO TABS: 50.0000 mg | ORAL_TABLET | Freq: Four times a day (QID) | ORAL | 0 refills | Status: DC | PRN

## 2024-06-08 MED ORDER — MISOPROSTOL 200 MCG PO TABS
ORAL_TABLET | ORAL | 0 refills | Status: DC
Start: 2024-06-08 — End: 2024-09-24

## 2024-06-08 NOTE — MAU Provider Note (Addendum)
 Chief Complaint: Vaginal Bleeding   Event Date/Time   First Provider Initiated Contact with Patient 06/08/24 0034      SUBJECTIVE HPI: Crystal Powell is a 28 y.o. G2P1001 at [redacted]w[redacted]d by early ultrasound who presents to maternity admissions reporting onset of vaginal bleeding 2 days ago that has increased slightly today. Bleeding is light red, mostly seen when wiping.  She has associated mild cramping and loose stools.  She has not taken anything for pain.   HPI  Past Medical History:  Diagnosis Date   Chronic hypertension affecting pregnancy 01/20/2022   Gestational diabetes mellitus    Past Surgical History:  Procedure Laterality Date   NO PAST SURGERIES     Social History   Socioeconomic History   Marital status: Married    Spouse name: Derry Arbogast   Number of children: 1   Years of education: Not on file   Highest education level: Not on file  Occupational History   Not on file  Tobacco Use   Smoking status: Never   Smokeless tobacco: Never  Vaping Use   Vaping status: Never Used  Substance and Sexual Activity   Alcohol use: Never   Drug use: Never   Sexual activity: Yes    Birth control/protection: Implant  Other Topics Concern   Not on file  Social History Narrative   Not on file   Social Drivers of Health   Financial Resource Strain: Not on file  Food Insecurity: No Food Insecurity (11/04/2021)   Hunger Vital Sign    Worried About Running Out of Food in the Last Year: Never true    Ran Out of Food in the Last Year: Never true  Transportation Needs: Not on file  Physical Activity: Not on file  Stress: Not on file  Social Connections: Not on file  Intimate Partner Violence: Not on file   No current facility-administered medications on file prior to encounter.   Current Outpatient Medications on File Prior to Encounter  Medication Sig Dispense Refill   Prenatal Vit-Fe Fumarate-FA (PRENATAL VITAMINS PO) Take by mouth.     Allergies  Allergen  Reactions   Peanut-Containing Drug Products Anaphylaxis    ROS:  Review of Systems  Constitutional:  Negative for chills, fatigue and fever.  Respiratory:  Negative for shortness of breath.   Cardiovascular:  Negative for chest pain.  Gastrointestinal:  Positive for abdominal pain.  Genitourinary:  Positive for pelvic pain and vaginal bleeding. Negative for difficulty urinating, dysuria, flank pain, vaginal discharge and vaginal pain.  Neurological:  Negative for dizziness and headaches.  Psychiatric/Behavioral: Negative.       I have reviewed patient's Past Medical Hx, Surgical Hx, Family Hx, Social Hx, medications and allergies.   Physical Exam  Patient Vitals for the past 24 hrs:  BP Temp Temp src Pulse Resp SpO2 Height Weight  06/08/24 0111 (!) 141/88 98.2 F (36.8 C) Oral (!) 106 18 100 % -- --  06/07/24 1949 (!) 156/75 98.8 F (37.1 C) Oral (!) 101 16 100 % 5' (1.524 m) 81.9 kg   Constitutional: Well-developed, well-nourished female in no acute distress.  Cardiovascular: normal rate Respiratory: normal effort GI: Abd soft, non-tender. Pos BS x 4 MS: Extremities nontender, no edema, normal ROM Neurologic: Alert and oriented x 4.  GU: Neg CVAT.  PELVIC EXAM: Deferred   LAB RESULTS Results for orders placed or performed during the hospital encounter of 06/07/24 (from the past 24 hours)  Wet prep, genital  Status: Abnormal   Collection Time: 06/07/24  7:51 PM  Result Value Ref Range   Yeast Wet Prep HPF POC NONE SEEN NONE SEEN   Trich, Wet Prep NONE SEEN NONE SEEN   Clue Cells Wet Prep HPF POC NONE SEEN NONE SEEN   WBC, Wet Prep HPF POC >=10 (A) <10   Sperm NONE SEEN   Urinalysis, Routine w reflex microscopic -Urine, Clean Catch     Status: Abnormal   Collection Time: 06/07/24  7:51 PM  Result Value Ref Range   Color, Urine YELLOW YELLOW   APPearance HAZY (A) CLEAR   Specific Gravity, Urine 1.012 1.005 - 1.030   pH 6.0 5.0 - 8.0   Glucose, UA NEGATIVE  NEGATIVE mg/dL   Hgb urine dipstick LARGE (A) NEGATIVE   Bilirubin Urine NEGATIVE NEGATIVE   Ketones, ur NEGATIVE NEGATIVE mg/dL   Protein, ur NEGATIVE NEGATIVE mg/dL   Nitrite NEGATIVE NEGATIVE   Leukocytes,Ua NEGATIVE NEGATIVE   RBC / HPF 0-5 0 - 5 RBC/hpf   WBC, UA 0-5 0 - 5 WBC/hpf   Bacteria, UA RARE (A) NONE SEEN   Squamous Epithelial / HPF 11-20 0 - 5 /HPF   Mucus PRESENT     A/Negative/-- (06/19 1140)  IMAGING US  OB Transvaginal Result Date: 06/08/2024 CLINICAL DATA:  vaginal bleeding first trimester. Assigned gestational age [redacted] weeks, 3 days EXAM: TRANSVAGINAL OB ULTRASOUND TECHNIQUE: Transvaginal ultrasound was performed for complete evaluation of the gestation as well as the maternal uterus, adnexal regions, and pelvic cul-de-sac. COMPARISON:  05/28/2024 FINDINGS: Intrauterine gestational sac: Present, single Yolk sac: Present, though abnormal and enlarged since prior examination now measuring up to 13 mm (cine sequence # 1). Embryo: Present though demonstrating an irregular, amorphous contour Cardiac Activity: Not identified Heart Rate:   Not applicable CRL:   12 mm   7 w 3 d                  US  EDC: Not applicable Subchorionic hemorrhage:  None visualized. Maternal uterus/adnexae: The uterus is anteverted. The cervix is closed and is unremarkable. No intrauterine masses are seen. A complex 5.3 cm right ovarian cystic mass is identified demonstrating irregular thickened walls and a thickened septation measuring up to 5 mm in thickness which demonstrates interval increase in size and cavitation since prior examination 06/01/2021 and is indeterminate. Left ovary is unremarkable. Trace simple appearing free fluid within the pelvis. IMPRESSION: Multiple abnormalities, as described above, in keeping with a failed gestation. Correlation with serial beta HCG examination and possible follow-up sonography in 3-5 days may be helpful to document resolution. Complex right ovarian mass, not  definitively characterized on this examination. MRI examination with contrast may be for definitive characterization. Electronically Signed   By: Dorethia Molt M.D.   On: 06/08/2024 00:14   US  OB LESS THAN 14 WEEKS WITH OB TRANSVAGINAL Result Date: 05/31/2024 ----------------------------------------------------------------------  OBSTETRICS REPORT                       (Signed Final 05/31/2024 08:01 pm) ---------------------------------------------------------------------- Patient Info  ID #:       [id number]                          D.O.B.:  01-12-1996 (28 yrs)(F)  Name:       Crystal Powell                 Visit Date: 05/28/2024 01:14 pm ----------------------------------------------------------------------  Performed By  Attending:        Glenys Birk MD         Ref. Address:     [address]                                                             [place], [place]                                                             [address]  Performed By:     Scarlet Flesher         [facility]  Referred By:      [facility] ---------------------------------------------------------------------- Orders  #  Description                           Code        Ordered By  1  US  OB LESS THAN 14 WEEKS              [id number]     LYNWOOD SOLOMONS     WITH OB TRANSVAGINAL ----------------------------------------------------------------------  #  Order #                     Accession #                Episode #  1  [id number]  [id number]                 [id number] ---------------------------------------------------------------------- Indications  Weeks of gestation of pregnancy not            Z3A.00   specified  Encounter for uncertain dates                  Z36.87  Pregnancy with inconclusive fetal viability    O36.80X0 ---------------------------------------------------------------------- Fetal Evaluation  Num Of Fetuses:         1  Preg. Location:         Intrauterine  Gest. Sac:              Intrauterine  Yolk Sac:               Visualized  Fetal Pole:             Visualized  Fetal Heart Rate(bpm):  117  Cardiac Activity:       Observed  Comment:    ? dermoid cyst RT ovary ---------------------------------------------------------------------- 1st Trimester Genetic Sonogram Screening  CRL:             5.8  mm    G. Age:   6w 3d                  EDD:   01/18/25 ---------------------------------------------------------------------- Impression  Single living IUP @ 6 3/7 weeks  size is not c/w dates  EDD by today's u/s  Right ovarian complex cyst, ? dermoid ---------------------------------------------------------------------- Recommendations  Follow-up as clinically indicated. ----------------------------------------------------------------------                  Glenys Birk, MD Electronically Signed Final Report   05/31/2024 08:01 pm ----------------------------------------------------------------------    MAU Management/MDM: Orders Placed This Encounter  Procedures   Wet prep, genital   US  OB Transvaginal   Urinalysis, Routine w reflex microscopic -Urine, Clean Catch   Discharge patient Discharge disposition: 01-Home or Self Care; Discharge patient date: 06/08/2024    Meds ordered this encounter  Medications   misoprostol  (CYTOTEC ) 200 MCG tablet    Sig: Put all 4 tablets in your cheek and let them soften, then swallow.    Dispense:  4 tablet    Refill:  0    Supervising Provider:   FORSYTH, KYMBERLY C [[id number]]   promethazine  (PHENERGAN ) 12.5 MG tablet    Sig: Take 1 tablet (12.5 mg total) by mouth every 6 (six) hours as needed for nausea or vomiting.    Dispense:  30 tablet    Refill:  0     Supervising Provider:   FORSYTH, KYMBERLY C [[id number]]   traMADol  (ULTRAM ) 50 MG tablet    Sig: Take 1-2 tablets (50-100 mg total) by mouth every 6 (six) hours as needed.    Dispense:  6 tablet    Refill:  0    Supervising Provider:   ERIK KIETH BROCKS [[id number]]    US  today definitive for failed pregnancy/missed ab. Discussed options with patient including expectant management, Cytotec  Rx, or D&C scheduled in 2-3 days to 1 week.  Benefits/risks discussed for all options.  All options include close follow up in the office.  Pt chooses Cytotec  and prescription sent for Cytotec , Tramadol , and Phenergan .  Pt BP borderline elevated but pt visibly upset in MAU.  Message sent to [facility] for miscarriage follow up.   Miscarriage/bleeding precautions given.   RH negative, but given early gestational age, little benefit to rhogam at  this time.   ASSESSMENT 1. Incomplete miscarriage   2. Rh negative status during pregnancy in first trimester   3. [redacted] weeks gestation of pregnancy     PLAN Discharge home Allergies as of 06/08/2024       Reactions   Peanut-containing Drug Products Anaphylaxis        Medication List     TAKE these medications    misoprostol  200 MCG tablet Commonly known as: Cytotec  Put all 4 tablets in your cheek and let them soften, then swallow.   PRENATAL VITAMINS PO Take by mouth.   promethazine  12.5 MG tablet Commonly known as: PHENERGAN  Take 1 tablet (12.5 mg total) by mouth every 6 (six) hours as needed for nausea or vomiting.   traMADol  50 MG tablet Commonly known as: ULTRAM  Take 1-2 tablets (50-100 mg total) by mouth every 6 (six) hours as needed.        Follow-up Information     [facility] Follow up.   Specialty: Obstetrics and Gynecology Why: The office will schedule you for follow up labs and visit with Dr Barbra. Contact information: [address] [place]   [id number] [phone]        [facility] Follow up.   Specialty: Obstetrics and Gynecology Why: As needed for emergencies Contact information: [address] [place]  [address] [phone]                Olam Boards Certified Nurse-Midwife 06/08/2024  2:48 AM

## 2024-06-10 LAB — GC/CHLAMYDIA PROBE AMP (~~LOC~~) NOT AT ARMC
Chlamydia: NEGATIVE
Comment: NEGATIVE
Comment: NORMAL
Neisseria Gonorrhea: NEGATIVE

## 2024-06-14 ENCOUNTER — Other Ambulatory Visit

## 2024-06-14 DIAGNOSIS — O021 Missed abortion: Secondary | ICD-10-CM

## 2024-06-14 NOTE — Progress Notes (Signed)
 Patient here to pick up lab req.  Sent to lab.  Crystal Powell

## 2024-06-15 LAB — BETA HCG QUANT (REF LAB): hCG Quant: 92 m[IU]/mL

## 2024-06-17 ENCOUNTER — Encounter: Payer: Self-pay | Admitting: Obstetrics and Gynecology

## 2024-06-17 ENCOUNTER — Other Ambulatory Visit (HOSPITAL_COMMUNITY)
Admission: RE | Admit: 2024-06-17 | Discharge: 2024-06-17 | Disposition: A | Discharge status: Home / Self Care | Source: Ambulatory Visit | Attending: Obstetrics and Gynecology | Admitting: Obstetrics and Gynecology

## 2024-06-17 ENCOUNTER — Ambulatory Visit: Admitting: Obstetrics and Gynecology

## 2024-06-17 VITALS — BP 114/81 | HR 91 | Ht 60.0 in | Wt 179.0 lb

## 2024-06-17 DIAGNOSIS — N83201 Unspecified ovarian cyst, right side: Secondary | ICD-10-CM

## 2024-06-17 DIAGNOSIS — Z124 Encounter for screening for malignant neoplasm of cervix: Secondary | ICD-10-CM

## 2024-06-17 DIAGNOSIS — R7989 Other specified abnormal findings of blood chemistry: Secondary | ICD-10-CM | POA: Diagnosis not present

## 2024-06-17 DIAGNOSIS — Z5189 Encounter for other specified aftercare: Secondary | ICD-10-CM | POA: Diagnosis not present

## 2024-06-17 DIAGNOSIS — O039 Complete or unspecified spontaneous abortion without complication: Secondary | ICD-10-CM | POA: Diagnosis not present

## 2024-06-17 NOTE — Progress Notes (Signed)
   ESTABLISHED GYNECOLOGY VISIT Chief Complaint  Patient presents with   Gynecologic Exam    Follow up to SAB    Subjective:  Crystal Powell is a 28 y.o. G2P1011 presenting for miscarriage follow up.  She had miscarriage diagnosed on 7/12 via ultrasound and had cytotec  for management. She reports bleeding after cytotec  that has now subsided. Denies pelvic pain, fevers, bleeding.  HCG on 7/18 was 92   Pelvic ultrasound 7/11: Maternal uterus/adnexae: The uterus is anteverted. The cervix is closed and is unremarkable. No intrauterine masses are seen. A complex 5.3 cm right ovarian cystic mass is identified demonstrating irregular thickened walls and a thickened septation measuring up to 5 mm in thickness which demonstrates interval increase in size and cavitation since prior examination 06/01/2021 and is indeterminate. Left ovary is unremarkable. Trace simple appearing free fluid within the pelvis.   IMPRESSION: Multiple abnormalities, as described above, in keeping with a failed gestation. Correlation with serial beta HCG examination and possible follow-up sonography in 3-5 days may be helpful to document resolution.   Review of Systems:   Pertinent items are noted in HPI  Pertinent History Reviewed:  Reviewed past medical,surgical, social and family history.  Reviewed problem list, medications and allergies.  Objective:   Vitals:   06/17/24 0958  Weight: 179 lb (81.2 kg)  Height: 5' (1.524 m)   Physical Examination:   General appearance - well appearing, and in no distress  Mental status - alert, oriented to person, place, and time  Psych:  normal mood and affect  Skin - warm and dry, normal color, no suspicious lesions noted  Abdomen - soft, nontender, nondistended, no masses or organomegaly  Pelvic -  VULVA: normal appearing vulva with no masses, tenderness or lesions   VAGINA: normal appearing vagina with normal color and discharge, no lesions   CERVIX:  normal appearing cervix without discharge or lesions, no CMT   Thin prep pap is done with reflex HR HPV cotesting   Bimanual exam deferred  Extremities:  No swelling or varicosities noted  Chaperone present for exam  Assessment and Plan:  1. Follow-up visit after miscarriage (Primary) Will repeat ultrasound to ensure no retained products and trend down HCG Anticipatory guidance Patient declines contraception, interested in conceiving, continue prenatal vitamin, ok to try to conceive after next menstrual cycle - US  PELVIC COMPLETE WITH TRANSVAGINAL; Future - Beta hCG quant (ref lab); Future  2. Right ovarian cyst Repeat ultrasound planned - US  PELVIC COMPLETE WITH TRANSVAGINAL; Future  3. Cervical cancer screening - Cytology - PAP( [place])  4. Low vitamin D level Patient requests vitamin D level, previously low - VITAMIN D 25 Hydroxy (Vit-D Deficiency, Fractures); Future    No follow-ups on file.  No future appointments.  Rollo [name], MD, FACOG Obstetrician & Gynecologist, [facility] for [facility], [facility]

## 2024-06-20 ENCOUNTER — Ambulatory Visit: Payer: Self-pay | Admitting: Obstetrics and Gynecology

## 2024-06-20 LAB — CYTOLOGY - PAP
Adequacy: ABSENT
Diagnosis: NEGATIVE

## 2024-06-22 LAB — BETA HCG QUANT (REF LAB): hCG Quant: 7 m[IU]/mL

## 2024-06-22 LAB — VITAMIN D 25 HYDROXY (VIT D DEFICIENCY, FRACTURES): Vit D, 25-Hydroxy: 50.1 ng/mL (ref 30.0–100.0)

## 2024-06-24 ENCOUNTER — Ambulatory Visit (HOSPITAL_BASED_OUTPATIENT_CLINIC_OR_DEPARTMENT_OTHER)
Admission: RE | Admit: 2024-06-24 | Discharge: 2024-06-24 | Disposition: A | Discharge status: Home / Self Care | Source: Ambulatory Visit | Attending: Obstetrics and Gynecology | Admitting: Obstetrics and Gynecology

## 2024-06-24 DIAGNOSIS — N83201 Unspecified ovarian cyst, right side: Secondary | ICD-10-CM | POA: Diagnosis present

## 2024-06-24 DIAGNOSIS — Z5189 Encounter for other specified aftercare: Secondary | ICD-10-CM | POA: Insufficient documentation

## 2024-06-24 DIAGNOSIS — O039 Complete or unspecified spontaneous abortion without complication: Secondary | ICD-10-CM | POA: Insufficient documentation

## 2024-06-27 ENCOUNTER — Encounter: Admitting: Family Medicine

## 2024-07-01 ENCOUNTER — Encounter: Payer: Self-pay | Admitting: Obstetrics and Gynecology

## 2024-07-01 DIAGNOSIS — N9489 Other specified conditions associated with female genital organs and menstrual cycle: Secondary | ICD-10-CM

## 2024-07-01 DIAGNOSIS — F419 Anxiety disorder, unspecified: Secondary | ICD-10-CM

## 2024-07-01 NOTE — Telephone Encounter (Signed)
 Spoke with patient by phone to review ultrasound results. Reviewed some concerning features on ultrasound. Recommend tumor markers and MRI before proceeding with surgical intervention to determine whether best managed by general gyn or gyn onc.  Patient tearful, states has been having some trouble with mood since her miscarriage, stressed by ultrasound findings. [facility] referral, which she is interested in. Referral placed.  Rollo [name], MD, FACOG Obstetrician & Gynecologist, [facility] for [facility], [facility]

## 2024-07-02 ENCOUNTER — Other Ambulatory Visit (INDEPENDENT_AMBULATORY_CARE_PROVIDER_SITE_OTHER)

## 2024-07-02 DIAGNOSIS — N9489 Other specified conditions associated with female genital organs and menstrual cycle: Secondary | ICD-10-CM

## 2024-07-02 DIAGNOSIS — N838 Other noninflammatory disorders of ovary, fallopian tube and broad ligament: Secondary | ICD-10-CM

## 2024-07-02 NOTE — Progress Notes (Signed)
Patient presents for labs. Patient was sent to the lab to have labs drawn. Crystal Powell, CMA

## 2024-07-03 NOTE — Telephone Encounter (Signed)
-----   Message from Rollo [name] sent at 07/01/2024 10:04 AM EDT ----- Vitamin D  level normal. Needs repeat HCG this week. Please inform and arrange lab appt ----- Message ----- From: Interface, Lab In [facility] Sent: 06/20/2024   4:06 PM EDT To: Rollo [name], MD

## 2024-07-03 NOTE — Telephone Encounter (Signed)
 Patient is aware.  Had quant drawn yesterday.  Waiting on results.  Crystal Powell

## 2024-07-09 ENCOUNTER — Ambulatory Visit (INDEPENDENT_AMBULATORY_CARE_PROVIDER_SITE_OTHER): Payer: Self-pay | Admitting: Licensed Clinical Social Worker

## 2024-07-09 ENCOUNTER — Ambulatory Visit (HOSPITAL_COMMUNITY)
Admission: RE | Admit: 2024-07-09 | Discharge: 2024-07-09 | Disposition: A | Discharge status: Home / Self Care | Source: Ambulatory Visit | Attending: Obstetrics and Gynecology | Admitting: Obstetrics and Gynecology

## 2024-07-09 DIAGNOSIS — F4321 Adjustment disorder with depressed mood: Secondary | ICD-10-CM

## 2024-07-09 DIAGNOSIS — N9489 Other specified conditions associated with female genital organs and menstrual cycle: Secondary | ICD-10-CM | POA: Diagnosis present

## 2024-07-09 DIAGNOSIS — F4323 Adjustment disorder with mixed anxiety and depressed mood: Secondary | ICD-10-CM

## 2024-07-09 MED ORDER — DIPHENHYDRAMINE HCL 50 MG/ML IJ SOLN
50.0000 mg | Freq: Once | INTRAMUSCULAR | Status: DC
  Administered 2024-07-09 (×2): 50 mg via INTRAVENOUS

## 2024-07-09 MED ORDER — FAMOTIDINE 20 MG PO TABS
20.0000 mg | ORAL_TABLET | Freq: Once | ORAL | Status: DC
  Filled 2024-07-09: qty 1

## 2024-07-09 MED ORDER — FAMOTIDINE IN NACL 20-0.9 MG/50ML-% IV SOLN
20.0000 mg | Freq: Once | INTRAVENOUS | Status: DC
  Administered 2024-07-09 (×2): 20 mg via INTRAVENOUS
  Filled 2024-07-09: qty 50

## 2024-07-09 MED ORDER — GADOBUTROL 1 MMOL/ML IV SOLN
8.0000 mL | Freq: Once | INTRAVENOUS | Status: AC | PRN
  Administered 2024-07-09 (×2): 8 mL via INTRAVENOUS

## 2024-07-09 NOTE — Progress Notes (Signed)
 Received call from imaging staff to report that patient was experiencing reaction after receiving IV contrast.   Exam:  Airway unobstructed, no visible oral eruptions Scattered, hives to chest, stomach, back.  Lungs CTAB  She denies SOB, urticaria of mouth/throat, N/V, abd pain, mouth/throat swelling. Endorses hives with urticaria.  SBP 130, HR 90s.    Allergies updated. Received IV benadryl  50 mg. Pepcid  20 mg PO.   Returned to patient bedside after receiving benadryl , states that symptoms are improving. Mother is also now at bedside. Patient agrees with plan for her remain for 30 minutes and then discharge if symptoms continue to improve. Will repeat benadryl  later tonight.   Patient directed to return to ED immediately if she experiences airway constriction, itching around her mouth or to her throat, racing heart rate, persistent N/V.

## 2024-07-09 NOTE — BH Specialist Note (Unsigned)
 Integrated Behavioral Health via Telemedicine Visit  07/15/2024 Crystal Powell [id number]  Number of Integrated Behavioral Health Clinician visits: 1- Initial Visit  Session Start time: 0915   Session End time: 1006  Total time in minutes: 51    Referring Provider: Rollo Bring, MD Patient/Family location: Home [facility] Provider location: Remote Office All persons participating in visit: Patient and [facility] Types of Service: Individual psychotherapy and Video visit  I connected with Crystal Powell and/or Crystal Powell's patient via  Telephone or Video Enabled Telemedicine Application  (Video is Caregility application) and verified that I am speaking with the correct person using two identifiers. Discussed confidentiality: Yes   I discussed the limitations of telemedicine and the availability of in person appointments.  Discussed there is a possibility of technology failure and discussed alternative modes of communication if that failure occurs.  I discussed that engaging in this telemedicine visit, they consent to the provision of behavioral healthcare and the services will be billed under their insurance.  Patient and/or legal guardian expressed understanding and consented to Telemedicine visit: Yes   Presenting Concerns: Patient and/or family reports the following symptoms/concerns: Grief, misscarriage  Duration of problem: Weeks; Severity of problem: moderate  Patient and/or Family's Strengths/Protective Factors: Social connections, Social and Emotional competence, Concrete supports in place (healthy food, safe environments, etc.), and Physical Health (exercise, healthy diet, medication compliance, etc.)  Goals Addressed: Patient will:  Reduce symptoms of: depression   Increase knowledge and/or ability of: coping skills and healthy habits   Demonstrate ability to: Increase healthy adjustment to current life circumstances, Increase adequate support systems for patient/family,  and Begin healthy grieving over loss  Progress towards Goals: Ongoing    Interventions: Interventions utilized:  Mindfulness or Management consultant, Supportive Counseling, Psychoeducation and/or Health Education, and Supportive Reflection Standardized Assessments completed: Not Needed    Patient and/or Family Response: Patient was present for today's virtual session. She reported ongoing grief following a recent miscarriage on 06/08/24 and described feeling emotionally overwhelmed by compounded stressors. In addition to her loss, patient shared that she is experiencing heightened anxiety and depressive symptoms after learning of an ovarian cyst discovered during a post-miscarriage ultrasound. While the cyst was initially not a concern to providers, recent communication from her care team has shifted toward recommending removal. Patient is currently awaiting pathology results to determine if the cyst is benign or cancerous.  Patient verbalized a belief that the cyst is benign but acknowledged persistent "what if" thoughts that contribute to her emotional distress. She expressed feeling as though she has not had space to fully grieve the loss of her baby due to the medical uncertainty. Despite this, patient shared she feels supported by her husband, who is a Advice worker, and finds some comfort in his presence and knowledge. She demonstrated insight into the nature of grief and is working to navigate both loss and health-related anxiety.  Clinical Assessment/Diagnosis  Grief associated with loss of fetus  Adjustment disorder with mixed anxiety and depressed mood    Assessment: Patient is experiencing acute grief, anxiety, and symptoms of depression in the aftermath of pregnancy loss and pending medical results. She is emotionally overwhelmed but shows insight, emotional expression, and has strong support from her partner..   Patient may benefit from continued support of integrated  behaviorsl .  Plan: Follow up with behavioral health clinician on : 07/16/24 Behavioral recommendations: Recommend use of grounding techniques, structured journaling, or brief mindfulness exercises to manage intrusive "what if" thoughts. Reinforce the importance of  self-compassion and pacing in grief. Encourage ongoing emotional expression and continued therapeutic support through this period of uncertainty. Follow up after medical results to reassess coping and emotional needs. Referral(s): Integrated [employer] (In Clinic)  I discussed the assessment and treatment plan with the patient and/or parent/guardian. They were provided an opportunity to ask questions and all were answered. They agreed with the plan and demonstrated an understanding of the instructions.   They were advised to call back or seek an in-person evaluation if the symptoms worsen or if the condition fails to improve as anticipated.  Crystal Powell Seats, LCSWA

## 2024-07-10 ENCOUNTER — Encounter: Payer: Self-pay | Admitting: Obstetrics and Gynecology

## 2024-07-12 ENCOUNTER — Ambulatory Visit: Payer: Self-pay | Admitting: Obstetrics and Gynecology

## 2024-07-12 DIAGNOSIS — N83201 Unspecified ovarian cyst, right side: Secondary | ICD-10-CM

## 2024-07-12 LAB — ESTRADIOL: Estradiol: 68.5 pg/mL

## 2024-07-12 LAB — INHIBIN B: Inhibin B: 76.4 pg/mL

## 2024-07-12 LAB — TESTOSTERONE,FREE AND TOTAL
Testosterone, Free: 2.3 pg/mL (ref 0.0–4.2)
Testosterone: 50 ng/dL (ref 13–71)

## 2024-07-12 LAB — DHEA: Dehydroepiandrosterone (DHEA): 436 ng/dL (ref 31–701)

## 2024-07-12 LAB — POSTMENOPAUSAL INTERP: LOW

## 2024-07-12 LAB — INHIBIN A: Inhibin A, Ultrasensitive: 7.5 pg/mL

## 2024-07-12 LAB — OVARIAN MALIGNANCY RISK-ROMA
Cancer Antigen (CA) 125: 20.3 U/mL (ref 0.0–38.1)
HE4: 41.2 pmol/L (ref 0.0–61.2)
Postmenopausal ROMA: 1.17
Premenopausal ROMA: 0.49

## 2024-07-12 LAB — AFP TUMOR MARKER: AFP, Serum, Tumor Marker: 2.5 ng/mL (ref 0.0–4.7)

## 2024-07-12 LAB — PREMENOPAUSAL INTERP: LOW

## 2024-07-12 LAB — BETA HCG QUANT (REF LAB): hCG Quant: <1 m[IU]/mL

## 2024-07-12 LAB — LACTATE DEHYDROGENASE: LDH: 190 IU/L (ref 119–226)

## 2024-07-16 ENCOUNTER — Ambulatory Visit: Payer: Self-pay | Admitting: Licensed Clinical Social Worker

## 2024-07-16 DIAGNOSIS — F4321 Adjustment disorder with depressed mood: Secondary | ICD-10-CM

## 2024-07-16 DIAGNOSIS — F4323 Adjustment disorder with mixed anxiety and depressed mood: Secondary | ICD-10-CM

## 2024-07-16 NOTE — BH Specialist Note (Unsigned)
 Integrated Behavioral Health via Telemedicine Visit  07/19/2024 Crystal Powell [id number]  Number of Integrated Behavioral Health Clinician visits: 2- Second Visit  Session Start time: 0845   Session End time: 0932  Total time in minutes: 47    Referring Provider: Rollo Bring, MD Patient/Family location: Home [facility] Provider location: Remote Office All persons participating in visit: Patient and [facility] Types of Service: Individual psychotherapy and Video visit  I connected with [name] and/or Joesph Treinen's patient via  Telephone or Video Enabled Telemedicine Application  (Video is Caregility application) and verified that I am speaking with the correct person using two identifiers. Discussed confidentiality: Yes   I discussed the limitations of telemedicine and the availability of in person appointments.  Discussed there is a possibility of technology failure and discussed alternative modes of communication if that failure occurs.  I discussed that engaging in this telemedicine visit, they consent to the provision of behavioral healthcare and the services will be billed under their insurance.  Patient and/or legal guardian expressed understanding and consented to Telemedicine visit: Yes   Presenting Concerns: Patient and/or family reports the following symptoms/concerns: Ongoing anxiety symptoms.  Duration of problem: Months; Severity of problem: moderate  Patient and/or Family's Strengths/Protective Factors: Social and Emotional competence, Concrete supports in place (healthy food, safe environments, etc.), Physical Health (exercise, healthy diet, medication compliance, etc.), and Caregiver has knowledge of parenting & child development  Goals Addressed: Patient will:  Reduce symptoms of: depression   Increase knowledge and/or ability of: coping skills and healthy habits   Demonstrate ability to: Increase healthy adjustment to current life circumstances and  Increase adequate support systems for patient/family  Progress towards Goals: Ongoing    Interventions: Interventions utilized:  Mindfulness or Management consultant, Supportive Counseling, Psychoeducation and/or Health Education, Communication Skills, and Supportive Reflection Standardized Assessments completed: Not Needed    Patient and/or Family Response:Patient was present for today's scheduled virtual session. She processed a recent medical experience involving an MRI during which she experienced an allergic reaction. Patient reported attempting to use grounding techniques such as the 5 senses method but found it ineffective due to heightened anxiety in the moment. She noted that daily use of positive affirmations has been significantly helpful in maintaining emotional stability. Patient shared that she received results indicating a benign cyst; however, due to its size, removal of the ovary has been recommended. She expressed hesitancy about the procedure, as she and her husband are still considering future reproductive plans. Patient reported feeling that many of her medical questions remain unanswered, as communication has primarily occurred through the MyChart portal, which she finds impersonal and inadequate. She expressed interest in obtaining more detailed information about the proposed surgery prior to her upcoming family trip to [place] next week. Patient also discussed her ongoing grief process, stating that she is managing well by keeping busy, engaging in reading, and planning family activities with her daughter. She has created a list of things she would like to do together as a family, which she reported has been helpful for her emotional well-being.   Clinical Assessment/Diagnosis  Grief associated with loss of fetus  Adjustment disorder with mixed anxiety and depressed mood    Assessment: Patient currently experiencing anxiety related to recent medical procedures and  treatment decisions, as well as emotional processing around grief and uncertainty about her reproductive health. She is actively using coping strategies and focusing on family engagement to support her mental health..   Patient may benefit from continued support of  integrated behavioral health.  Plan: Follow up with behavioral health clinician on : 08/05/24 Behavioral recommendations: Recommend continued use of positive affirmations and structured activities to support emotional regulation. Encourage patient to advocate for more direct communication with her healthcare providers and to seek a consultation to address her questions prior to making surgical decisions. Referral(s): Integrated [employer] (In Clinic)  I discussed the assessment and treatment plan with the patient and/or parent/guardian. They were provided an opportunity to ask questions and all were answered. They agreed with the plan and demonstrated an understanding of the instructions.   They were advised to call back or seek an in-person evaluation if the symptoms worsen or if the condition fails to improve as anticipated.  Keyatta Tolles LITTIE Seats, LCSWA

## 2024-08-05 ENCOUNTER — Encounter: Payer: Self-pay | Admitting: Licensed Clinical Social Worker

## 2024-08-12 ENCOUNTER — Ambulatory Visit (INDEPENDENT_AMBULATORY_CARE_PROVIDER_SITE_OTHER): Payer: Self-pay | Admitting: Licensed Clinical Social Worker

## 2024-08-12 DIAGNOSIS — F4321 Adjustment disorder with depressed mood: Secondary | ICD-10-CM

## 2024-08-12 DIAGNOSIS — F4323 Adjustment disorder with mixed anxiety and depressed mood: Secondary | ICD-10-CM

## 2024-08-12 NOTE — BH Specialist Note (Unsigned)
 Integrated Behavioral Health via Telemedicine Visit  08/13/2024 Crystal Powell [id number]  Number of Integrated Behavioral Health Clinician visits: 3- Third Visit  Session Start time: 0815   Session End time: 0852  Total time in minutes: 37    Referring Provider: Rollo Bring, MD Patient/Family location: Home [facility] Provider location: Remote Office All persons participating in visit: Patient and Crystal Powell Types of Service: Individual psychotherapy and Video visit  I connected with Crystal Powell and/or Crystal Powell's patient via  Telephone or Video Enabled Telemedicine Application  (Video is Caregility application) and verified that I am speaking with the correct person using two identifiers. Discussed confidentiality: Yes   I discussed the limitations of telemedicine and the availability of in person appointments.  Discussed there is a possibility of technology failure and discussed alternative modes of communication if that failure occurs.  I discussed that engaging in this telemedicine visit, they consent to the provision of behavioral healthcare and the services will be billed under their insurance.  Patient and/or legal guardian expressed understanding and consented to Telemedicine visit: Yes   Presenting Concerns: Patient and/or family reports the following symptoms/concerns: ongoing anxiety symptoms.  Duration of problem: Months; Severity of problem: moderate  Patient and/or Family's Strengths/Protective Factors: Social and Emotional competence, Concrete supports in place (healthy food, safe environments, etc.), and Physical Health (exercise, healthy diet, medication compliance, etc.)  Goals Addressed: Patient will:  Reduce symptoms of: depression   Increase knowledge and/or ability of: coping skills and healthy habits   Demonstrate ability to: Increase healthy adjustment to current life circumstances and Increase adequate support systems for patient/family  Progress  towards Goals: Ongoing    Interventions: Interventions utilized:  Solution-Focused Strategies, Mindfulness or Management consultant, Supportive Counseling, Psychoeducation and/or Health Education, and Supportive Reflection Standardized Assessments completed: Not Needed    Patient and/or Family Response: Patient was present for today's virtual session and expressed frustration regarding ongoing communication challenges with her surgical team. She reported that she has not received clear information or instructions about her upcoming surgery despite sending multiple messages through MyChart to the surgery scheduler. The lack of clarity and prolonged wait has significantly increased her anxiety symptoms, as she feels unable to plan or prepare effectively. She shared that she was advised not to become pregnant due to potential complications but is not currently using birth control. She expressed hesitation to start birth control unless the surgery is confirmed soon, as she hopes to have more children in the future. The uncertainty surrounding her health care and family planning has contributed to emotional distress and feelings of life being at a standstill.  Despite these stressors, the patient shared a positive update about a recent family vacation, which she described as uplifting and enjoyable. She is continuing to engage in healthy coping strategies such as reading and building Legos, which she finds helpful in managing anxiety and maintaining emotional balance.   Clinical Assessment/Diagnosis  Grief associated with loss of fetus  Adjustment disorder with mixed anxiety and depressed mood    Assessment: Patient is currently experiencing heightened anxiety related to medical uncertainty, communication delays, and interrupted future planning, while also demonstrating the use of positive coping skills and engagement in meaningful activities..   Patient may benefit from continued support of  integrated behavioral health services.  Plan: Follow up with behavioral health clinician on : 08/20/24 Behavioral recommendations: Recommendations include continued support in managing anxiety related to medical uncertainty, reinforcing adaptive coping strategies, and exploring ways to regain a sense of control through  structured routines and advocacy in healthcare communication. Patient encouraged to document all communication attempts and consider calling the medical office directly for clarity. Referral(s): Integrated [employer] (In Clinic)  I discussed the assessment and treatment plan with the patient and/or parent/guardian. They were provided an opportunity to ask questions and all were answered. They agreed with the plan and demonstrated an understanding of the instructions.   They were advised to call back or seek an in-person evaluation if the symptoms worsen or if the condition fails to improve as anticipated.  Kenzli Barritt LITTIE Seats, LCSWA

## 2024-08-20 ENCOUNTER — Ambulatory Visit: Payer: Self-pay | Admitting: Licensed Clinical Social Worker

## 2024-08-20 ENCOUNTER — Encounter: Payer: Self-pay | Admitting: Obstetrics and Gynecology

## 2024-08-20 DIAGNOSIS — F4323 Adjustment disorder with mixed anxiety and depressed mood: Secondary | ICD-10-CM

## 2024-08-20 DIAGNOSIS — F4321 Adjustment disorder with depressed mood: Secondary | ICD-10-CM

## 2024-08-20 NOTE — BH Specialist Note (Signed)
 Integrated Behavioral Health via Telemedicine Visit  08/26/2024 Crystal Powell [id number]  Number of Integrated Behavioral Health Clinician visits: 4- Fourth Visit  Session Start time: 0915   Session End time: 0943  Total time in minutes: 28    Referring Provider: Rollo Bring, MD Patient/Family location: Home [facility] Provider location: Remote Office All persons participating in visit: Patient and [facility] Types of Service: Individual psychotherapy and Video visit  I connected with Joesph Powell and/or Joesph Powell's patient via  Telephone or Video Enabled Telemedicine Application  (Video is Caregility application) and verified that I am speaking with the correct person using two identifiers. Discussed confidentiality: Yes   I discussed the limitations of telemedicine and the availability of in person appointments.  Discussed there is a possibility of technology failure and discussed alternative modes of communication if that failure occurs.  I discussed that engaging in this telemedicine visit, they consent to the provision of behavioral healthcare and the services will be billed under their insurance.  Patient and/or legal guardian expressed understanding and consented to Telemedicine visit: Yes   Presenting Concerns: Patient and/or family reports the following symptoms/concerns: Improvements with mood and anxiety symptoms.  Duration of problem: Months; Severity of problem: moderate  Patient and/or Family's Strengths/Protective Factors: Social and Emotional competence, Concrete supports in place (healthy food, safe environments, etc.), Physical Health (exercise, healthy diet, medication compliance, etc.), and Caregiver has knowledge of parenting & child development  Goals Addressed: Patient will:  Reduce symptoms of: anxiety and depression   Increase knowledge and/or ability of: coping skills and healthy habits   Demonstrate ability to: Increase healthy adjustment to current  life circumstances and Increase adequate support systems for patient/family  Progress towards Goals: Achieved    Interventions: Interventions utilized:  Solution-Focused Strategies, Supportive Counseling, Psychoeducation and/or Health Education, and Communication Skills Standardized Assessments completed: Not Needed    Patient and/or Family Response: The patient was present for today's session and reported feeling significantly better and in a more positive mental space after receiving confirmation from the surgery scheduler. She shared that her surgery is scheduled for October 28th and expressed happiness and relief at finally having a date. The patient was also able to speak with the scheduler about what to expect before, during, and after the procedure, which provided her with further reassurance. She feels particularly relieved knowing that her OB will be performing the surgery and plans to schedule a virtual visit beforehand to discuss details further.  The patient reported a notable decrease in anxiety symptoms since returning from her vacation and continues to prioritize self-care by reading, watching new TV series, spending quality time with her toddler, and staying active to manage her mood. She expressed feeling well overall and indicated her interest in discontinuing therapy services for now, with the option to return following her surgery or if symptoms return or worsen.  Clinical Assessment/Diagnosis  Grief associated with loss of fetus  Adjustment disorder with mixed anxiety and depressed mood    Assessment: The patient is currently experiencing increased emotional stability, reduced anxiety symptoms, and a sense of relief and preparedness regarding her upcoming surgery..   Patient may benefit from continued support of integrated behaiovrsl .  Plan: Follow up with behavioral health clinician on : Will follow up when needed.  Behavioral recommendations: Recommendations to  include patient continuing her current self-care practices, maintaining open communication with medical providers, and re-engaging in therapy services as needed for post-surgical support or if anxiety symptoms re-emerge. Referral(s): Integrated [employer] (  In Clinic)  I discussed the assessment and treatment plan with the patient and/or parent/guardian. They were provided an opportunity to ask questions and all were answered. They agreed with the plan and demonstrated an understanding of the instructions.   They were advised to call back or seek an in-person evaluation if the symptoms worsen or if the condition fails to improve as anticipated.  Crystal Powell, LCSWA

## 2024-08-21 ENCOUNTER — Encounter: Payer: Self-pay | Admitting: Obstetrics and Gynecology

## 2024-09-16 ENCOUNTER — Encounter (HOSPITAL_COMMUNITY): Payer: Self-pay | Admitting: Obstetrics and Gynecology

## 2024-09-16 NOTE — Progress Notes (Signed)
 Spoke w/ via phone for pre-op interview--- Crystal Powell needs dos----Surgeon orders requested 09/16/24. UPT per anesthesia.         Powell results------ COVID test -----patient states asymptomatic no test needed Arrive at -------0845 NPO after MN NO Solid Food.  Clear liquids from MN until---0745 Pre-Surgery Ensure or G2:  Med rec completed Medications to take morning of surgery -----NONE Diabetic medication -----  GLP1 agonist last dose: GLP1 instructions:  Patient instructed no nail polish to be worn day of surgery Patient instructed to bring photo id and insurance card day of surgery Patient aware to have Driver (ride ) / caregiver    for 24 hours after surgery - Husband Crystal Powell Patient Special Instructions ----- Pre-Op special Instructions -----  Patient verbalized understanding of instructions that were given at this phone interview. Patient denies chest pain, sob, fever, cough at the interview.

## 2024-09-24 ENCOUNTER — Ambulatory Visit (HOSPITAL_COMMUNITY)

## 2024-09-24 ENCOUNTER — Other Ambulatory Visit: Payer: Self-pay

## 2024-09-24 ENCOUNTER — Ambulatory Visit (HOSPITAL_COMMUNITY)
Admission: RE | Admit: 2024-09-24 | Discharge: 2024-09-24 | Disposition: A | Discharge status: Home / Self Care | Attending: Obstetrics and Gynecology | Admitting: Obstetrics and Gynecology

## 2024-09-24 ENCOUNTER — Encounter (HOSPITAL_COMMUNITY)
Admission: RE | Disposition: A | Discharge status: Home / Self Care | Payer: Self-pay | Source: Home / Self Care | Attending: Obstetrics and Gynecology

## 2024-09-24 ENCOUNTER — Encounter (HOSPITAL_COMMUNITY): Payer: Self-pay | Admitting: Obstetrics and Gynecology

## 2024-09-24 DIAGNOSIS — N9489 Other specified conditions associated with female genital organs and menstrual cycle: Secondary | ICD-10-CM | POA: Diagnosis present

## 2024-09-24 DIAGNOSIS — D270 Benign neoplasm of right ovary: Secondary | ICD-10-CM | POA: Diagnosis not present

## 2024-09-24 DIAGNOSIS — N838 Other noninflammatory disorders of ovary, fallopian tube and broad ligament: Secondary | ICD-10-CM | POA: Diagnosis present

## 2024-09-24 DIAGNOSIS — N83201 Unspecified ovarian cyst, right side: Secondary | ICD-10-CM | POA: Diagnosis not present

## 2024-09-24 DIAGNOSIS — Z01818 Encounter for other preprocedural examination: Secondary | ICD-10-CM

## 2024-09-24 HISTORY — DX: Anxiety disorder, unspecified: F41.9

## 2024-09-24 HISTORY — DX: Family history of other specified conditions: Z84.89

## 2024-09-24 HISTORY — PX: LAPAROSCOPIC SALPINGO OOPHERECTOMY: SHX5927

## 2024-09-24 LAB — CBC
HCT: 41.8 % (ref 36.0–46.0)
Hemoglobin: 14.6 g/dL (ref 12.0–15.0)
MCH: 29.5 pg (ref 26.0–34.0)
MCHC: 34.9 g/dL (ref 30.0–36.0)
MCV: 84.4 fL (ref 80.0–100.0)
Platelets: 324 K/uL (ref 150–400)
RBC: 4.95 MIL/uL (ref 3.87–5.11)
RDW: 11.9 % (ref 11.5–15.5)
WBC: 6.8 K/uL (ref 4.0–10.5)
nRBC: 0 % (ref 0.0–0.2)

## 2024-09-24 LAB — POCT PREGNANCY, URINE: Preg Test, Ur: NEGATIVE

## 2024-09-24 SURGERY — SALPINGO-OOPHORECTOMY, LAPAROSCOPIC
Anesthesia: General | Site: Abdomen | Laterality: Right

## 2024-09-24 MED ORDER — ACETAMINOPHEN 500 MG PO TABS
ORAL_TABLET | ORAL | Status: AC
  Filled 2024-09-24: qty 2

## 2024-09-24 MED ORDER — MIDAZOLAM HCL 2 MG/2ML IJ SOLN
INTRAMUSCULAR | Status: AC
  Filled 2024-09-24: qty 2

## 2024-09-24 MED ORDER — PROPOFOL 10 MG/ML IV BOLUS
INTRAVENOUS | Status: AC
Start: 2024-09-24 — End: 2024-09-24
  Filled 2024-09-24: qty 20

## 2024-09-24 MED ORDER — BUPIVACAINE-EPINEPHRINE (PF) 0.25% -1:200000 IJ SOLN
INTRAMUSCULAR | Status: AC
  Filled 2024-09-24: qty 30

## 2024-09-24 MED ORDER — POVIDONE-IODINE 10 % EX SWAB: 2.0000 | Freq: Once | CUTANEOUS | Status: DC

## 2024-09-24 MED ORDER — KETOROLAC TROMETHAMINE 30 MG/ML IJ SOLN
INTRAMUSCULAR | Status: DC | PRN
  Administered 2024-09-24: 30 mg via INTRAVENOUS

## 2024-09-24 MED ORDER — ORAL CARE MOUTH RINSE: 15.0000 mL | Freq: Once | OROMUCOSAL | Status: AC

## 2024-09-24 MED ORDER — ROCURONIUM BROMIDE 10 MG/ML (PF) SYRINGE
PREFILLED_SYRINGE | INTRAVENOUS | Status: DC | PRN
  Administered 2024-09-24: 50 mg via INTRAVENOUS

## 2024-09-24 MED ORDER — OXYCODONE HCL 5 MG PO TABS: 5.0000 mg | ORAL_TABLET | Freq: Once | ORAL | Status: DC | PRN

## 2024-09-24 MED ORDER — OXYCODONE HCL 5 MG/5ML PO SOLN: 5.0000 mg | Freq: Once | ORAL | Status: DC | PRN

## 2024-09-24 MED ORDER — FENTANYL CITRATE (PF) 100 MCG/2ML IJ SOLN
INTRAMUSCULAR | Status: AC
  Filled 2024-09-24: qty 2

## 2024-09-24 MED ORDER — ROCURONIUM BROMIDE 10 MG/ML (PF) SYRINGE
PREFILLED_SYRINGE | INTRAVENOUS | Status: AC
  Filled 2024-09-24: qty 10

## 2024-09-24 MED ORDER — SODIUM CHLORIDE 0.9 % IR SOLN
Status: DC | PRN
  Administered 2024-09-24: 1000 mL

## 2024-09-24 MED ORDER — OXYCODONE-ACETAMINOPHEN 5-325 MG PO TABS: 1.0000 | ORAL_TABLET | Freq: Four times a day (QID) | ORAL | 0 refills | Status: DC | PRN

## 2024-09-24 MED ORDER — LIDOCAINE 2% (20 MG/ML) 5 ML SYRINGE
INTRAMUSCULAR | Status: AC
  Filled 2024-09-24: qty 5

## 2024-09-24 MED ORDER — LACTATED RINGERS IV SOLN
INTRAVENOUS | Status: DC
Start: 2024-09-24 — End: 2024-09-24

## 2024-09-24 MED ORDER — BUPIVACAINE-EPINEPHRINE (PF) 0.25% -1:200000 IJ SOLN
INTRAMUSCULAR | Status: DC | PRN
  Administered 2024-09-24: 24 mL

## 2024-09-24 MED ORDER — DEXAMETHASONE SOD PHOSPHATE PF 10 MG/ML IJ SOLN
INTRAMUSCULAR | Status: DC | PRN
  Administered 2024-09-24: 10 mg via INTRAVENOUS

## 2024-09-24 MED ORDER — ACETAMINOPHEN 10 MG/ML IV SOLN: 1000.0000 mg | Freq: Once | INTRAVENOUS | Status: DC | PRN

## 2024-09-24 MED ORDER — MIDAZOLAM HCL (PF) 2 MG/2ML IJ SOLN
INTRAMUSCULAR | Status: DC | PRN
Start: 2024-09-24 — End: 2024-09-24
  Administered 2024-09-24: 2 mg via INTRAVENOUS

## 2024-09-24 MED ORDER — PROPOFOL 10 MG/ML IV BOLUS
INTRAVENOUS | Status: DC | PRN
Start: 2024-09-24 — End: 2024-09-24
  Administered 2024-09-24: 200 mg via INTRAVENOUS
  Administered 2024-09-24: 100 mg via INTRAVENOUS

## 2024-09-24 MED ORDER — CHLORHEXIDINE GLUCONATE 0.12 % MT SOLN
15.0000 mL | Freq: Once | OROMUCOSAL | Status: AC
  Administered 2024-09-24: 15 mL via OROMUCOSAL

## 2024-09-24 MED ORDER — ACETAMINOPHEN 500 MG PO TABS
1000.0000 mg | ORAL_TABLET | ORAL | Status: AC
  Administered 2024-09-24: 1000 mg via ORAL

## 2024-09-24 MED ORDER — IBUPROFEN 600 MG PO TABS: 600.0000 mg | ORAL_TABLET | Freq: Four times a day (QID) | ORAL | 3 refills | Status: DC | PRN

## 2024-09-24 MED ORDER — DOCUSATE SODIUM 100 MG PO CAPS: 100.0000 mg | ORAL_CAPSULE | Freq: Two times a day (BID) | ORAL | 0 refills | Status: DC

## 2024-09-24 MED ORDER — LIDOCAINE 2% (20 MG/ML) 5 ML SYRINGE
INTRAMUSCULAR | Status: DC | PRN
  Administered 2024-09-24: 100 mg via INTRAVENOUS

## 2024-09-24 MED ORDER — FENTANYL CITRATE (PF) 100 MCG/2ML IJ SOLN: 25.0000 ug | INTRAMUSCULAR | Status: DC | PRN

## 2024-09-24 MED ORDER — FENTANYL CITRATE (PF) 100 MCG/2ML IJ SOLN
INTRAMUSCULAR | Status: DC | PRN
  Administered 2024-09-24 (×2): 50 ug via INTRAVENOUS

## 2024-09-24 MED ORDER — SUGAMMADEX SODIUM 200 MG/2ML IV SOLN
INTRAVENOUS | Status: DC | PRN
  Administered 2024-09-24: 200 mg via INTRAVENOUS

## 2024-09-24 MED ORDER — ONDANSETRON HCL 4 MG/2ML IJ SOLN
INTRAMUSCULAR | Status: AC
  Filled 2024-09-24: qty 2

## 2024-09-24 MED ORDER — DROPERIDOL 2.5 MG/ML IJ SOLN: 0.6250 mg | Freq: Once | INTRAMUSCULAR | Status: DC | PRN

## 2024-09-24 MED ORDER — ONDANSETRON HCL 4 MG/2ML IJ SOLN
INTRAMUSCULAR | Status: DC | PRN
Start: 2024-09-24 — End: 2024-09-24
  Administered 2024-09-24: 4 mg via INTRAVENOUS

## 2024-09-24 MED ORDER — CHLORHEXIDINE GLUCONATE 0.12 % MT SOLN
OROMUCOSAL | Status: DC
Start: 2024-09-24 — End: 2024-09-24
  Filled 2024-09-24: qty 15

## 2024-09-24 SURGICAL SUPPLY — 25 items
CHLORAPREP W/TINT 26 (MISCELLANEOUS) ×2 IMPLANT
DRAPE SURG IRRIG POUCH 19X23 (DRAPES) ×2 IMPLANT
DRSG OPSITE POSTOP 3X4 (GAUZE/BANDAGES/DRESSINGS) ×6 IMPLANT
GLOVE BIO SURGEON STRL SZ 6.5 (GLOVE) ×2 IMPLANT
GLOVE BIOGEL PI IND STRL 6.5 (GLOVE) ×2 IMPLANT
GOWN STRL REUS W/ TWL LRG LVL3 (GOWN DISPOSABLE) ×2 IMPLANT
IRRIGATION SUCT STRKRFLW 2 WTP (MISCELLANEOUS) IMPLANT
KIT PINK PAD W/HEAD ARM REST (MISCELLANEOUS) ×2 IMPLANT
KIT TURNOVER KIT B (KITS) ×2 IMPLANT
LIGASURE VESSEL 5MM BLUNT TIP (ELECTROSURGICAL) IMPLANT
PACK LAPAROSCOPY BASIN (CUSTOM PROCEDURE TRAY) ×2 IMPLANT
POWDER SURGICEL 3.0 GRAM (HEMOSTASIS) IMPLANT
SET TUBE SMOKE EVAC HIGH FLOW (TUBING) ×2 IMPLANT
SLEEVE Z-THREAD 5X100MM (TROCAR) ×2 IMPLANT
SOLN 0.9% NACL POUR BTL 1000ML (IV SOLUTION) ×2 IMPLANT
STRIP CLOSURE SKIN 1/2X4 (GAUZE/BANDAGES/DRESSINGS) ×2 IMPLANT
SUT MNCRL AB 4-0 PS2 18 (SUTURE) ×2 IMPLANT
SUT VICRYL 0 UR6 27IN ABS (SUTURE) ×2 IMPLANT
SYSTEM BAG RETRIEVAL 10MM (BASKET) IMPLANT
TIP ENDOSCOPIC SURGICEL (TIP) IMPLANT
TOWEL GREEN STERILE FF (TOWEL DISPOSABLE) ×4 IMPLANT
TRAY FOLEY W/BAG SLVR 14FR (SET/KITS/TRAYS/PACK) ×2 IMPLANT
TROCAR BALLN 12MMX100 BLUNT (TROCAR) ×2 IMPLANT
TROCAR Z-THREAD OPTICAL 5X100M (TROCAR) ×2 IMPLANT
WARMER LAPAROSCOPE (MISCELLANEOUS) ×2 IMPLANT

## 2024-09-24 NOTE — Transfer of Care (Signed)
 Immediate Anesthesia Transfer of Care Note  Patient: Crystal Powell  Procedure(s) Performed: SALPINGO-OOPHORECTOMY, LAPAROSCOPIC (Right: Abdomen)  Patient Location: PACU  Anesthesia Type:General  Level of Consciousness: awake, alert , oriented, and patient cooperative  Airway & Oxygen Therapy: Patient Spontanous Breathing  Post-op Assessment: Report given to RN, Post -op Vital signs reviewed and stable, Patient moving all extremities X 4, and Patient able to stick tongue midline  Post vital signs: Reviewed and stable  Last Vitals:  Vitals Value Taken Time  BP 134/63 09/24/24 12:09  Temp 97.8   Pulse 115 09/24/24 12:10  Resp 27 09/24/24 12:10  SpO2 95 % 09/24/24 12:10  Vitals shown include unfiled device data.  Last Pain:  Vitals:   09/24/24 0900  TempSrc: Oral  PainSc: 0-No pain      Patients Stated Pain Goal: 5 (09/24/24 0900)  Complications: No notable events documented.

## 2024-09-24 NOTE — Anesthesia Procedure Notes (Signed)
 Procedure Name: Intubation Date/Time: 09/24/2024 10:47 AM  Performed by: Viviana Almarie DASEN, CRNAPre-anesthesia Checklist: Patient identified, Emergency Drugs available, Suction available and Patient being monitored Patient Re-evaluated:Patient Re-evaluated prior to induction Oxygen Delivery Method: Circle System Utilized Preoxygenation: Pre-oxygenation with 100% oxygen Induction Type: IV induction Ventilation: Mask ventilation without difficulty Laryngoscope Size: Mac and 3 Grade View: Grade I Tube type: Oral Tube size: 6.5 mm Number of attempts: 1 Airway Equipment and Method: Stylet Placement Confirmation: ETT inserted through vocal cords under direct vision, positive ETCO2 and breath sounds checked- equal and bilateral Secured at: 19 cm Tube secured with: Tape Dental Injury: Teeth and Oropharynx as per pre-operative assessment

## 2024-09-24 NOTE — Op Note (Signed)
 Zyionna Pesce PROCEDURE DATE: 09/24/2024  PREOPERATIVE DIAGNOSIS: Right ovarian mass  POSTOPERATIVE DIAGNOSIS: Same  PROCEDURE: Laparoscopic right ovarian cystectomy  SURGEON:  Dr. Rollo T. Abigail, MD  ASSISTANT: Dr. Jeralyn, MD  An experienced assistant was required given the standard of surgical care given the complexity of the case.  This assistant was needed for exposure, dissection, suctioning, retraction, instrument exchange, and for overall help during the procedure.  ANESTHESIOLOGY TEAM: Anesthesiologist: Erma Thom SAUNDERS, MD CRNA: Carolee Lauraine DASEN, CRNA; Viviana Norris T, CRNA  INDICATIONS: 28 y.o. [phone] here with the preoperative diagnoses as listed above.  Please refer to preoperative notes for more details. Patient was counseled regarding options and elected surgical management  FINDINGS:   1) 6 cm right ovarian cyst, contents appear consistent with dermoid cyst 2) Normal uterus, tubes and left ovary  ANESTHESIA: General, 0.25% marcaine  with epinephrine  local  INTRAVENOUS FLUIDS: 1600 ml  ESTIMATED BLOOD LOSS: 5 ml  URINE OUTPUT: 200 ml  SPECIMENS:  1) Right ovarian cyst 2) Pelvic washings  COMPLICATIONS: None immediate  PROCEDURE IN DETAIL:  The patient was taken to the operating room where general anesthesia was administered and was found to be adequate.  She was placed in the dorsal lithotomy position, and was prepped and draped in a sterile manner.  A Foley catheter was inserted into her bladder and a uterine manipulator was placed.    She was prepped and draped in the usual sterile fashion in the dorsal lithotomy position. 0.25% marcaine  with epinephrine  was injected at each port site prior to skin incision for a total of 24 ml. A skin incision was made with the scalpel inferior to the umbilicus. The abdomen was entered routinely with [facility] technique. The [facility] port was placed and the abdomen insufflated to 15 mmHg. The scalpel was then used to make two  lateral 5mm incisions, one on the right, one on the left.  Two 5mm ports were inserted under direct visualization. The patient was placed in trendelenburg.  Attention was then turned to the right ovary, which was incised with monopolar scissors at the border of the cyst and normal ovarian tissue. During this initial incision, there was minimal spillage of cyst contents (yellow mucinous appearing fluid) and the cyst otherwise remained intact. The incision was extended and the cyst was dissected out intact without further spillage. The cyst was placed in an endocatch bag and removed at the umbilicus. There was no spillage of cyst contents at the umbilicus. The pelvis was then irrigated and re-examined under reduced pressure. Good hemostasis was noted.  The abdomen was desufflated, and all instruments were removed. The foley catheter and uterine manipulator were removed.    The infraumbilical fascia incision was closed with 0-vicryl. The skin incisions were then closed in a subcuticular fashion with 4-0 monocryl and dressings were placed. She was taken to recovery in stable condition.    The patient will be discharged to home as per PACU criteria.  Routine postoperative instructions given.     She will follow up in the office in about 2-4 weeks for postoperative evaluation.   Rollo DASEN Abigail, MD, FACOG Obstetrician & Gynecologist, [facility] for [facility], [facility]

## 2024-09-24 NOTE — H&P (Signed)
 Preoperative History and Physical  Crystal Powell is a 28 y.o. G2P1011 here for surgical management of right ovarian mass.   No significant preoperative concerns.  She was diagnosed with right ovarian mass during ultrasound evaluation of her miscarriage in July. Ovarian mass was complex in appearance. Tumor markers were negative and MRI was suggestive of dermoid cyst. She was counseled on management and elected to proceed with surgical management  Proposed surgery: Laparoscopic right ovarian cystectomy, possible right salpingo-oophorectomy   MRI 07/09/2024: IMPRESSION: Complex multi septated heterogeneous right adnexal lesion measures 6.4 x 3.9 cm is most compatible with a dermoid cyst. Given size of the lesion would suggest surgical consultation and if surgical intervention is not performed a follow-up pelvic ultrasound in 6 months to assess stability.    Pelvic ultrasound 06/24/24: Right ovary   Measurements: 2.5 cm x 2.1 cm x 1.8 cm = volume: 5.0 mL. A 5.9 cm x 3.9 cm x 5.0 cm exophytic complex, mixed solid and cystic mass is seen within the right ovary. This is present on the prior study.   Left ovary   Measurements: 4.4 cm x 2.4 cm x 2.3 cm = volume: 12.4 mL. Normal appearance/no adnexal mass.   Other findings   No abnormal free fluid.   IMPRESSION: 1. Complex right ovarian mass, as described above. Further evaluation with MRI is recommended for improved characterization and to exclude the presence of an underlying neoplastic process. 2. No evidence of retained products of conception.  Past Medical History:  Diagnosis Date   Anxiety    Chronic hypertension affecting pregnancy 01/20/2022   Family history of adverse reaction to anesthesia    Mother - PONV   Gestational diabetes mellitus    Past Surgical History:  Procedure Laterality Date   NO PAST SURGERIES     WISDOM TOOTH EXTRACTION     OB History  Gravida Para Term Preterm AB Living  2 1 1  0 1 1   SAB IAB Ectopic Multiple Live Births  1 0 0 0 1    # Outcome Date GA Lbr Len/2nd Weight Sex Type Anes PTL Lv  2 SAB 06/08/24 [redacted]w[redacted]d    SAB     1 Term 01/22/22 [redacted]w[redacted]d 05:23 / 01:04 3500 g F Vag-Spont EPI  LIV  Patient denies any other pertinent gynecologic issues.   No current facility-administered medications on file prior to encounter.   Current Outpatient Medications on File Prior to Encounter  Medication Sig Dispense Refill   misoprostol  (CYTOTEC ) 200 MCG tablet Put all 4 tablets in your cheek and let them soften, then swallow. (Patient not taking: Reported on 06/17/2024) 4 tablet 0   Prenatal Vit-Fe Fumarate-FA (PRENATAL VITAMINS PO) Take by mouth. (Patient not taking: Reported on 09/16/2024)     promethazine  (PHENERGAN ) 12.5 MG tablet Take 1 tablet (12.5 mg total) by mouth every 6 (six) hours as needed for nausea or vomiting. (Patient not taking: Reported on 06/17/2024) 30 tablet 0   traMADol  (ULTRAM ) 50 MG tablet Take 1-2 tablets (50-100 mg total) by mouth every 6 (six) hours as needed. (Patient not taking: Reported on 06/17/2024) 6 tablet 0   Allergies  Allergen Reactions   Peanut-Containing Drug Products Anaphylaxis   Gadolinium Derivatives Hives and Itching    Pt had hives on her neck abdomen and back following contrast administration    Social History:   reports that she has never smoked. She has never used smokeless tobacco. She reports that she does not drink alcohol and  does not use drugs.  Family History  Problem Relation Age of Onset   Hypertension Mother    Diabetes Maternal Grandmother    Diabetes Maternal Grandfather    Hypertension Maternal Grandfather    Stroke Maternal Grandfather    Lung cancer Paternal Grandmother    Diabetes Paternal Grandmother    Cancer Paternal Grandfather     Review of Systems: Pertinent items noted in HPI and remainder of comprehensive ROS otherwise negative.  PHYSICAL EXAM: Blood pressure (!) 146/94, pulse (!) 117, temperature 98.1  F (36.7 C), temperature source Oral, resp. rate 16, height 5' 1 (1.549 m), weight 79.8 kg, last menstrual period 09/23/2024, SpO2 98%, not currently breastfeeding. CONSTITUTIONAL: Well-developed, well-nourished female in no acute distress.  HENT:  Normocephalic, atraumatic, External right and left ear normal. Oropharynx is clear and moist EYES: Conjunctivae and EOM are normal. Pupils are equal, round, and reactive to light. No scleral icterus.  NECK: Normal range of motion, supple, no masses SKIN: Skin is warm and dry. No rash noted. Not diaphoretic. No erythema. No pallor. NEUROLOGIC: Alert and oriented to person, place, and time. Normal reflexes, muscle tone coordination. No cranial nerve deficit noted. PSYCHIATRIC: Normal mood and affect. Normal behavior. Normal judgment and thought content. CARDIOVASCULAR: Normal heart rate noted, regular rhythm RESPIRATORY: Effort and breath sounds normal, no problems with respiration noted ABDOMEN: Soft, nontender, nondistended. PELVIC: Deferred    Labs: Results for orders placed or performed during the hospital encounter of 09/24/24 (from the past 2 weeks)  Pregnancy, urine POC   Collection Time: 09/24/24  8:58 AM  Result Value Ref Range   Preg Test, Ur NEGATIVE NEGATIVE  CBC   Collection Time: 09/24/24  9:28 AM  Result Value Ref Range   WBC 6.8 4.0 - 10.5 K/uL   RBC 4.95 3.87 - 5.11 MIL/uL   Hemoglobin 14.6 12.0 - 15.0 g/dL   HCT 58.1 63.9 - 53.9 %   MCV 84.4 80.0 - 100.0 fL   MCH 29.5 26.0 - 34.0 pg   MCHC 34.9 30.0 - 36.0 g/dL   RDW 88.0 88.4 - 84.4 %   Platelets 324 150 - 400 K/uL   nRBC 0.0 0.0 - 0.2 %    Imaging Studies: No results found.  Assessment: Active Problems:   Ovarian mass, right   Plan: Laparoscopic right ovarian cystectomy, possible right salpingo-oophorectomy  We reviewed the option of leaving the ovary in place vs removal of the ovary with the mass. We reviewed risks and benefits including impact on  fertility with ovarian removal and impact on early menopause as well as if the mass is a cancer, complete ovarian removal would be recommended. She has had an MRI that is suggestive of a dermoid cyst as well as tumor markers that were negative. We reviewed that either approach is reasonable based on her preferences and goals. She has decided to have ovarian cystectomy unless my assessment of the ovary at the time of surgery is that it appears worrisome for cancer.  Surgical consent: The risks, benefits and alternatives of the procedure were reviewed in detail. The risks of surgery were discussed with the patient including but were not limited to:    -- Bleeding with associated risks of blood transfusion  -- Infection which may require antibiotics -- Injury to bowel, bladder, ureters, tubes, ovaries, uterus or other surrounding organs  -- Need for additional procedures  --Thromboembolic phenomenon such as MI, CVA or death and other postoperative/anesthesia complications.     The  patient concurred with the proposed plan, giving informed written consent for the procedure.     Rollo [name], MD, FACOG Obstetrician & Gynecologist, [facility] for [facility], [place] [facility]

## 2024-09-24 NOTE — Anesthesia Preprocedure Evaluation (Signed)
 Anesthesia Evaluation  Patient identified by MRN, date of birth, ID band Patient awake    Reviewed: Allergy & Precautions, H&P , NPO status , Patient's Chart, lab work & pertinent test results  History of Anesthesia Complications Negative for: history of anesthetic complications  Airway Mallampati: II  TM Distance: >3 FB Neck ROM: Full    Dental no notable dental hx.    Pulmonary neg pulmonary ROS, neg sleep apnea   Pulmonary exam normal breath sounds clear to auscultation       Cardiovascular hypertension, (-) angina Normal cardiovascular exam Rhythm:Regular Rate:Normal     Neuro/Psych neg Seizures  Anxiety     negative neurological ROS     GI/Hepatic negative GI ROS, Neg liver ROS,neg GERD  ,,  Endo/Other  diabetes, Type 2    Renal/GU negative Renal ROS   Cyst of right ovary     Musculoskeletal negative musculoskeletal ROS (+)    Abdominal   Peds negative pediatric ROS (+)  Hematology negative hematology ROS (+)   Anesthesia Other Findings   Reproductive/Obstetrics negative OB ROS                              Anesthesia Physical Anesthesia Plan  ASA: 2  Anesthesia Plan: General   Post-op Pain Management: Tylenol  PO (pre-op)*   Induction: Intravenous  PONV Risk Score and Plan: 3 and Ondansetron , Dexamethasone, Midazolam and Treatment may vary due to age or medical condition  Airway Management Planned: Oral ETT  Additional Equipment: None  Intra-op Plan:   Post-operative Plan: Extubation in OR  Informed Consent: I have reviewed the patients History and Physical, chart, labs and discussed the procedure including the risks, benefits and alternatives for the proposed anesthesia with the patient or authorized representative who has indicated his/her understanding and acceptance.     Dental advisory given  Plan Discussed with: CRNA  Anesthesia Plan Comments:           Anesthesia Quick Evaluation

## 2024-09-24 NOTE — Anesthesia Postprocedure Evaluation (Signed)
 Anesthesia Post Note  Patient: Crystal Powell  Procedure(s) Performed: SALPINGO-OOPHORECTOMY, LAPAROSCOPIC (Right: Abdomen)     Patient location during evaluation: PACU Anesthesia Type: General Level of consciousness: awake and alert Pain management: pain level controlled Vital Signs Assessment: post-procedure vital signs reviewed and stable Respiratory status: spontaneous breathing, nonlabored ventilation, respiratory function stable and patient connected to nasal cannula oxygen Cardiovascular status: blood pressure returned to baseline and stable Postop Assessment: no apparent nausea or vomiting Anesthetic complications: no   No notable events documented.  Last Vitals:  Vitals:   09/24/24 1210 09/24/24 1212  BP:  134/63  Pulse: (!) 127 (!) 119  Resp: (!) 23 (!) 21  Temp:  36.6 C  SpO2: 96% 94%    Last Pain:  Vitals:   09/24/24 0900  TempSrc: Oral  PainSc: 0-No pain                 Crystal Powell

## 2024-09-24 NOTE — Discharge Instructions (Signed)

## 2024-09-25 ENCOUNTER — Telehealth: Payer: Self-pay

## 2024-09-25 ENCOUNTER — Encounter (HOSPITAL_COMMUNITY): Payer: Self-pay | Admitting: Obstetrics and Gynecology

## 2024-09-25 LAB — CYTOLOGY - NON PAP

## 2024-09-25 NOTE — Telephone Encounter (Signed)
 Spoke with patient and scheduled her for 11/24.

## 2024-09-25 NOTE — Telephone Encounter (Signed)
-----   Message from [facility] C sent at 09/24/2024  1:06 PM EDT ----- Regarding: FW: Postop appt  ----- Message ----- From: Abigail Rollo DASEN, MD Sent: 09/24/2024  12:02 PM EDT To: [facility] Subject: Postop appt                                    Patient needs a postop appointment in 2-4 weeks. Thank you.

## 2024-09-27 ENCOUNTER — Ambulatory Visit: Payer: Self-pay | Admitting: Obstetrics and Gynecology

## 2024-09-27 LAB — SURGICAL PATHOLOGY

## 2024-10-07 NOTE — Progress Notes (Signed)
 Adult prophylaxis [facility] 10/07/24  Dental procedures in this visit  . I8889 - PROPHYLAXIS - ADULT (Completed)    Service provider: Shawnee Herald, [facility]    Billing provider: Selinda Benne, DDS  . I9879 - PERIODIC ORAL EVALUATION - ESTABLISHED PATIENT (Completed)    Service provider: Selinda Benne, DDS    Billing provider: Selinda Benne, DDS   HEALTH HISTORY ? Vitals:  BP Readings from Last 1 Encounters:  10/07/24 (!) 149/89  Pulse:  98  Medical history was reviewed and updated. No contraindication to care.  Medical History[1] Surgical History[2] Social History[3] Family History[4] Medications Ordered Prior to Encounter[5]  Medical Risk Assessment ASA GRADE: ASA 2 - Patient with mild systemic disease with no functional limitations  Subjective: Patient reports tumor removed from ovary, and miscarriage.   Objective:  Radiographs taken: No radiographs captured at this appointment.  Dental Examination Dr. Benne  completed an exam and noted the following conditions and/or findings: 31-ol, had previously discussed crown now recommending 31-ol due to stickyness getting started.  Intraoral exam completed and findings: wnl. Extraoral exam completed and findings: wnl.  Periodontal Evaluation Periodontal Charting: Updated full mouth periodontal charting and documented all findings in patient's tooth chart: Yes Radiographic studies show bone levels WNL  Periodontal findings:  Tissue color: Pink, consistency: Stippled CAL: 1-3 mm Bleeding: Less than 30% of sites, localized Furcation involvements: none Mobility: None Calculus: Light subgingival plaque and calculus Patient oral hygiene: Good Patient's oral hygiene is worse since last visit  Assessment: Periodontal Diagnosis: Healthy, within normal limits. Dental Diagnosis: Caries, restorable. Risks and potential complications: caries growing.  Informed patient of all periodontal findings. Informed patient of  recommended treatment procedure(s) based on findings. Patient confirmed they understood and has no additional questions.  Plan:  handscale, floss, polish with Nupro coarse paste, Fluoride varnish (5% Sodium Fluoride) placed at end of appointment  OHI: Reviewed brushing and flossing technique.  Additional Notes: none  Recommended recall interval: 6 months.  Next Visit: Recall/maintenance, 31-ol   Treatment Providers Shawnee Herald, [facility] Dr Selinda [facility]  [facility] - [facility] - [facility] [address] [place] [address] [phone]       [1] Past Medical History: Diagnosis Date  . History of gestational diabetes   . History of miscarriage   [2] Past Surgical History: Procedure Laterality Date  . 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 Smoking status: Never    Passive exposure: Never  . Smokeless tobacco: Never  Substance Use Topics  . Alcohol use: Never  . Drug use: Never  [4] No family history on file. [5] Current Outpatient Medications on File Prior to Visit  Medication Sig Dispense Refill  . ibuprofen  (ADVIL ,MOTRIN ) 600 mg tablet Take 1 tablet by mouth in the morning and 1 tablet at noon and 1 tablet in the evening and 1 tablet before bedtime.    . [DISCONTINUED] docusate sodium  (COLACE) 100 mg capsule Take 100 mg by mouth. (Patient not taking: Reported on 10/07/2024)    . [DISCONTINUED] meloxicam (MOBIC) 7.5 mg tablet Take 7.5 mg by mouth 1 (one) time each day.    . [DISCONTINUED] Prenatal Vitamin Plus Low Iron 27 mg iron- 1 mg tablet Take 1 tablet by mouth 1 (one) time each day.    . [DISCONTINUED] prenatal vitamins-iron fumarate-folic acid (VYNATAL) 65 mg iron- 1 mg tablet Take by mouth. (Patient not taking:  Reported on 10/07/2024)     No current facility-administered medications on file prior to visit.

## 2024-10-21 ENCOUNTER — Ambulatory Visit (INDEPENDENT_AMBULATORY_CARE_PROVIDER_SITE_OTHER): Admitting: Obstetrics and Gynecology

## 2024-10-21 VITALS — BP 135/95 | HR 100 | Ht 61.0 in | Wt 180.1 lb

## 2024-10-21 DIAGNOSIS — Z09 Encounter for follow-up examination after completed treatment for conditions other than malignant neoplasm: Secondary | ICD-10-CM

## 2024-10-21 NOTE — Progress Notes (Addendum)
   ESTABLISHED GYNECOLOGY VISIT Chief Complaint  Patient presents with   Follow-up    Post op    Subjective:  Crystal Powell is a 28 y.o. G2P1011 presenting for postop visit after laparoscopic right ovarian cystectomy 09/24/24  Pathology confirms benign dermoid  Doing well. Has returned to most activities at this point. Happy with her recovery    Review of Systems:   Pertinent items are noted in HPI  Pertinent History Reviewed:  Reviewed past medical,surgical, social and family history.  Reviewed problem list, medications and allergies.  Objective:   Vitals:   10/21/24 1434  BP: (!) 135/95  Pulse: 100  Weight: 180 lb 1.3 oz (81.7 kg)  Height: 5' 1 (1.549 m)   Physical Examination:   General appearance - well appearing, and in no distress  Mental status - alert, oriented to person, place, and time  Psych:  normal mood and affect  Skin - warm and dry, normal color, no suspicious lesions noted  Abdomen - soft, nontender, nondistended, no masses or organomegaly  Laparoscopic incisions healing well, suture knot trimmed  Pelvic - deferred     Assessment and Plan:  1. Postop check (Primary) Doing well Anticipatory guidance Pathology reviewed Ok to return to usual activities F/u prn   No follow-ups on file.  No future appointments.  Rollo [name], MD, FACOG Obstetrician & Gynecologist, [facility] for [facility], [facility]
